# Patient Record
Sex: Male | Born: 1945 | Race: Black or African American | Hispanic: No | Smoking: Never smoker
Health system: Southern US, Community
[De-identification: ages and names within clinical notes are randomized; demographics above are authoritative.]

## PROBLEM LIST (undated history)

## (undated) DIAGNOSIS — I10 Essential (primary) hypertension: Secondary | ICD-10-CM

## (undated) HISTORY — PX: PROSTATE BIOPSY: SHX241

## (undated) HISTORY — PX: PROSTATECTOMY: SHX69

---

## 2011-07-21 ENCOUNTER — Ambulatory Visit: Payer: Self-pay | Admitting: Family Medicine

## 2011-07-21 VITALS — BP 202/101 | HR 72 | Temp 97.7°F | Resp 18 | Ht 69.0 in | Wt 211.0 lb

## 2011-07-21 DIAGNOSIS — R361 Hematospermia: Secondary | ICD-10-CM

## 2011-07-21 LAB — POCT UA - MICROSCOPIC ONLY
Bacteria, U Microscopic: NEGATIVE
Casts, Ur, LPF, POC: NEGATIVE
Crystals, Ur, HPF, POC: NEGATIVE
Mucus, UA: NEGATIVE
Yeast, UA: NEGATIVE

## 2011-07-21 LAB — PSA: PSA: 2.11 ng/mL (ref <=4.00)

## 2011-07-21 LAB — POCT URINALYSIS DIPSTICK
Bilirubin, UA: NEGATIVE
Blood, UA: NEGATIVE
Glucose, UA: NEGATIVE
Ketones, UA: NEGATIVE
Nitrite, UA: NEGATIVE
Protein, UA: 30
Spec Grav, UA: 1.02
Urobilinogen, UA: 4
pH, UA: 6

## 2011-07-21 LAB — IFOBT (OCCULT BLOOD): IFOBT: NEGATIVE

## 2011-07-21 NOTE — Progress Notes (Signed)
66 yo disabled veteran who was mastubating yesterday and noted blood in his semen which alarmed him.  He usually goes to the [place], where is being treated for PTSD.  He has refused colonoscopy in the past (I encouraged him to reconsider) and left herniorrhaphy (I encouraged a repair).  O:  Alert, cooperative Genitalia:  Large left hernia which is reducible.  Normal penis and right testes Prostate:  Normal to dre Results for orders placed in visit on 07/21/11  IFOBT (OCCULT BLOOD)      Component Value Range   IFOBT Negative    POCT URINALYSIS DIPSTICK      Component Value Range   Color, UA yellow     Clarity, UA clear     Glucose, UA neg     Bilirubin, UA neg     Ketones, UA neg     Spec Grav, UA 1.020     Blood, UA neg     pH, UA 6.0     Protein, UA 30 mg/dl     Urobilinogen, UA 4.0     Nitrite, UA neg     Leukocytes, UA small (1+)    POCT UA - MICROSCOPIC ONLY      Component Value Range   WBC, Ur, HPF, POC 7-8     RBC, urine, microscopic 0-1     Bacteria, U Microscopic neg     Mucus, UA neg     Epithelial cells, urine per micros 5-10     Crystals, Ur, HPF, POC neg     Casts, Ur, LPF, POC neg     Yeast, UA neg       A:  Hematospermia, inguinal hernia  P:  I encouraged patient to get colonoscopy and herniorrhaphy with his PCP's at [facility] Check hemossure, PSA, U/A

## 2011-07-23 ENCOUNTER — Telehealth: Payer: Self-pay

## 2011-07-23 LAB — URINE CULTURE: Colony Count: 15000

## 2011-07-23 NOTE — Telephone Encounter (Signed)
[facility] IS IN OFFICE NOW AND THEY ARE IN NEED OF HIS LABS, STATES WE USUALLY SENDS THEM WITHOUT AN AUTH PLEASE FAX TO [phone] AND THE PHONE NUMBER IS [phone]  PT IS IN OFFICE NOW

## 2011-07-23 NOTE — Telephone Encounter (Signed)
Faxed over lab results per request

## 2014-07-06 DIAGNOSIS — M549 Dorsalgia, unspecified: Secondary | ICD-10-CM

## 2014-07-06 DIAGNOSIS — M25569 Pain in unspecified knee: Secondary | ICD-10-CM

## 2014-07-06 HISTORY — DX: Dorsalgia, unspecified: M54.9

## 2014-07-06 HISTORY — DX: Pain in unspecified knee: M25.569

## 2014-07-11 DIAGNOSIS — N529 Male erectile dysfunction, unspecified: Secondary | ICD-10-CM

## 2014-07-11 DIAGNOSIS — I10 Essential (primary) hypertension: Secondary | ICD-10-CM

## 2014-07-11 DIAGNOSIS — G4700 Insomnia, unspecified: Secondary | ICD-10-CM

## 2014-07-11 DIAGNOSIS — M79643 Pain in unspecified hand: Secondary | ICD-10-CM

## 2014-07-11 HISTORY — DX: Essential (primary) hypertension: I10

## 2014-07-11 HISTORY — DX: Male erectile dysfunction, unspecified: N52.9

## 2014-07-11 HISTORY — DX: Insomnia, unspecified: G47.00

## 2014-07-11 HISTORY — DX: Pain in unspecified hand: M79.643

## 2014-08-09 ENCOUNTER — Encounter (HOSPITAL_COMMUNITY): Payer: Self-pay | Admitting: Emergency Medicine

## 2014-08-09 ENCOUNTER — Emergency Department (HOSPITAL_COMMUNITY)
Admission: EM | Admit: 2014-08-09 | Discharge: 2014-08-09 | Disposition: A | Discharge status: Home / Self Care | Payer: Federal, State, Local not specified - PPO | Attending: Emergency Medicine | Admitting: Emergency Medicine

## 2014-08-09 DIAGNOSIS — I10 Essential (primary) hypertension: Secondary | ICD-10-CM | POA: Insufficient documentation

## 2014-08-09 DIAGNOSIS — R040 Epistaxis: Secondary | ICD-10-CM | POA: Insufficient documentation

## 2014-08-09 DIAGNOSIS — Z79899 Other long term (current) drug therapy: Secondary | ICD-10-CM | POA: Diagnosis not present

## 2014-08-09 HISTORY — DX: Essential (primary) hypertension: I10

## 2014-08-09 MED ORDER — OXYMETAZOLINE HCL 0.05 % NA SOLN
1.0000 | Freq: Once | NASAL | Status: AC
  Administered 2014-08-09: 1 via NASAL
  Filled 2014-08-09: qty 15

## 2014-08-09 NOTE — Discharge Instructions (Signed)
If nosebleed recurs, apply direct pressure. If no relief used Afrin the same was we used in ED.  Do not use afrin for more than 3 consecutive days. Follow-up with ENT if needed-- call to schedule appt if continue having issues with nosebleed. Return to the ED for new or worsening symptoms.

## 2014-08-09 NOTE — ED Provider Notes (Signed)
CSN: [id number]     Arrival date & time 08/09/14  0209 History   First MD Initiated Contact with Patient 08/09/14 0229     Chief Complaint  Patient presents with  . Epistaxis     (Consider location/radiation/quality/duration/timing/severity/associated sxs/prior Treatment) Patient is a 69 y.o. male presenting with nosebleeds. The history is provided by the patient and medical records.  Epistaxis   69 year old male with history of hypertension here with epistaxis. He states over the past 2 months he has been having intermittent issues with nosebleeds but he is usually able to control these by applying pressure at home. He states current nosebleed began around 2230 and has been intermittent since that time. He states he has been packing his nose with tissue but once he pulls the tissue about it begins bleeding again.  Patient is not currently on any type of anticoagulation.  He denies any nasal injuries or instrumentation.  No dizziness or lightheadedness. No feelings of syncope. VSS.  Past Medical History  Diagnosis Date  . Hypertension    No past surgical history on file. No family history on file. History  Substance Use Topics  . Smoking status: Never Smoker   . Smokeless tobacco: Not on file  . Alcohol Use: Not on file    Review of Systems  HENT: Positive for nosebleeds.   All other systems reviewed and are negative.     Allergies  Review of patient's allergies indicates no known allergies.  Home Medications   Prior to Admission medications   Medication Sig Start Date End Date Taking? Authorizing Provider  b complex vitamins tablet Take 1 tablet by mouth daily.    Historical Provider, MD  lisinopril (PRINIVIL,ZESTRIL) 20 MG tablet Take 20 mg by mouth daily.    Historical Provider, MD  Multiple Vitamin (MULTIVITAMIN) tablet Take 1 tablet by mouth daily.    Historical Provider, MD   BP 160/68 mmHg  Pulse 92  Temp(Src) 97.5 F (36.4 C) (Oral)  SpO2 100%   Physical  Exam  Constitutional: He is oriented to person, place, and time. He appears well-developed and well-nourished.  HENT:  Head: Normocephalic and atraumatic.  Mouth/Throat: Oropharynx is clear and moist.  Tissue present in left nare-- tissue removed without recurrence of bleeding; appears to be small clot noted along the lateral wall of left nare; no signs of trauma; no septal deviation or septal hematoma Right nare normal  Eyes: Conjunctivae and EOM are normal. Pupils are equal, round, and reactive to light.  Neck: Normal range of motion. Neck supple.  Cardiovascular: Normal rate, regular rhythm and normal heart sounds.   Pulmonary/Chest: Effort normal and breath sounds normal.  Musculoskeletal: Normal range of motion.  Neurological: He is alert and oriented to person, place, and time.  Skin: Skin is warm and dry.  Psychiatric: He has a normal mood and affect.  Nursing note and vitals reviewed.   ED Course  Procedures (including critical care time) Labs Review Labs Reviewed - No data to display  Imaging Review No results found.   EKG Interpretation None      MDM   Final diagnoses:  Nosebleed   69 year old male with nosebleed from left nare onset 2230.  On exam, tissue stuffed into left nostril.  Tissue removed, no evidence of recurrent bleeding.  Afrin applied and patient monitored for approx 1 hour without recurrent of nosebleed.  Will d/c home with supportive care.  Recommended direct pressure for recurrent bleed, if no relief will use afrin.  Instructed not to use Afrin for more than 3 consecutive days in a row. Patient given ENT follow-up if needed.  Discussed plan with patient, he/she acknowledged understanding and agreed with plan of care.  Return precautions given for new or worsening symptoms.  Garlon Hatchet, PA-C 08/09/14 0402  Paula Libra, MD 08/09/14 [phone]

## 2014-08-09 NOTE — ED Notes (Signed)
Pt states he has a hx of hypertension and nosebleeds but tonight he couldn't get his nose to stop bleeding. Stuffed L nostril with tissue at the moment. Alert and oriented.

## 2015-02-12 DIAGNOSIS — E669 Obesity, unspecified: Secondary | ICD-10-CM

## 2015-02-12 DIAGNOSIS — N433 Hydrocele, unspecified: Secondary | ICD-10-CM

## 2015-02-12 HISTORY — DX: Hydrocele, unspecified: N43.3

## 2015-02-12 HISTORY — DX: Obesity, unspecified: E66.9

## 2015-09-02 DIAGNOSIS — I10 Essential (primary) hypertension: Secondary | ICD-10-CM | POA: Diagnosis not present

## 2015-09-02 DIAGNOSIS — Z0189 Encounter for other specified special examinations: Secondary | ICD-10-CM | POA: Diagnosis not present

## 2015-09-02 DIAGNOSIS — Z125 Encounter for screening for malignant neoplasm of prostate: Secondary | ICD-10-CM | POA: Diagnosis not present

## 2016-01-14 DIAGNOSIS — Z86010 Personal history of colonic polyps: Secondary | ICD-10-CM

## 2016-01-14 DIAGNOSIS — Z860101 Personal history of adenomatous and serrated colon polyps: Secondary | ICD-10-CM

## 2016-01-14 HISTORY — DX: Personal history of adenomatous and serrated colon polyps: Z86.0101

## 2016-01-14 HISTORY — DX: Personal history of colonic polyps: Z86.010

## 2016-02-07 DIAGNOSIS — B1920 Unspecified viral hepatitis C without hepatic coma: Secondary | ICD-10-CM | POA: Diagnosis not present

## 2016-02-07 DIAGNOSIS — M545 Low back pain: Secondary | ICD-10-CM | POA: Diagnosis not present

## 2016-02-07 DIAGNOSIS — I10 Essential (primary) hypertension: Secondary | ICD-10-CM | POA: Diagnosis not present

## 2016-02-07 DIAGNOSIS — Z0000 Encounter for general adult medical examination without abnormal findings: Secondary | ICD-10-CM | POA: Diagnosis not present

## 2016-06-09 DIAGNOSIS — I10 Essential (primary) hypertension: Secondary | ICD-10-CM | POA: Diagnosis not present

## 2016-06-09 DIAGNOSIS — Z125 Encounter for screening for malignant neoplasm of prostate: Secondary | ICD-10-CM | POA: Diagnosis not present

## 2016-06-09 DIAGNOSIS — N529 Male erectile dysfunction, unspecified: Secondary | ICD-10-CM | POA: Diagnosis not present

## 2016-06-09 DIAGNOSIS — H9193 Unspecified hearing loss, bilateral: Secondary | ICD-10-CM | POA: Diagnosis not present

## 2016-06-09 DIAGNOSIS — Z13220 Encounter for screening for lipoid disorders: Secondary | ICD-10-CM | POA: Diagnosis not present

## 2016-07-21 DIAGNOSIS — H40003 Preglaucoma, unspecified, bilateral: Secondary | ICD-10-CM | POA: Diagnosis not present

## 2016-07-21 DIAGNOSIS — H524 Presbyopia: Secondary | ICD-10-CM | POA: Diagnosis not present

## 2016-07-21 DIAGNOSIS — I10 Essential (primary) hypertension: Secondary | ICD-10-CM | POA: Diagnosis not present

## 2016-07-21 DIAGNOSIS — H2513 Age-related nuclear cataract, bilateral: Secondary | ICD-10-CM | POA: Diagnosis not present

## 2016-08-05 DIAGNOSIS — M79642 Pain in left hand: Secondary | ICD-10-CM | POA: Diagnosis not present

## 2016-08-05 DIAGNOSIS — I10 Essential (primary) hypertension: Secondary | ICD-10-CM | POA: Diagnosis not present

## 2016-08-05 DIAGNOSIS — M79641 Pain in right hand: Secondary | ICD-10-CM | POA: Diagnosis not present

## 2016-08-05 DIAGNOSIS — Z125 Encounter for screening for malignant neoplasm of prostate: Secondary | ICD-10-CM | POA: Diagnosis not present

## 2016-08-05 DIAGNOSIS — M545 Low back pain: Secondary | ICD-10-CM | POA: Diagnosis not present

## 2016-08-19 DIAGNOSIS — H524 Presbyopia: Secondary | ICD-10-CM | POA: Diagnosis not present

## 2016-11-04 DIAGNOSIS — Z01818 Encounter for other preprocedural examination: Secondary | ICD-10-CM | POA: Diagnosis not present

## 2016-11-04 DIAGNOSIS — Z86010 Personal history of colonic polyps: Secondary | ICD-10-CM | POA: Diagnosis not present

## 2016-11-04 DIAGNOSIS — Z8619 Personal history of other infectious and parasitic diseases: Secondary | ICD-10-CM | POA: Diagnosis not present

## 2016-11-19 DIAGNOSIS — H40003 Preglaucoma, unspecified, bilateral: Secondary | ICD-10-CM | POA: Diagnosis not present

## 2016-12-01 DIAGNOSIS — K8020 Calculus of gallbladder without cholecystitis without obstruction: Secondary | ICD-10-CM

## 2016-12-01 DIAGNOSIS — K8050 Calculus of bile duct without cholangitis or cholecystitis without obstruction: Secondary | ICD-10-CM | POA: Diagnosis not present

## 2016-12-01 DIAGNOSIS — K7689 Other specified diseases of liver: Secondary | ICD-10-CM | POA: Diagnosis not present

## 2016-12-01 HISTORY — DX: Calculus of gallbladder without cholecystitis without obstruction: K80.20

## 2016-12-16 DIAGNOSIS — H524 Presbyopia: Secondary | ICD-10-CM | POA: Diagnosis not present

## 2017-02-01 DIAGNOSIS — I10 Essential (primary) hypertension: Secondary | ICD-10-CM | POA: Diagnosis not present

## 2017-02-01 DIAGNOSIS — R972 Elevated prostate specific antigen [PSA]: Secondary | ICD-10-CM | POA: Diagnosis not present

## 2017-02-04 DIAGNOSIS — K649 Unspecified hemorrhoids: Secondary | ICD-10-CM | POA: Diagnosis not present

## 2017-02-04 DIAGNOSIS — Z1211 Encounter for screening for malignant neoplasm of colon: Secondary | ICD-10-CM | POA: Diagnosis not present

## 2017-02-04 DIAGNOSIS — Z86010 Personal history of colonic polyps: Secondary | ICD-10-CM | POA: Diagnosis not present

## 2017-02-04 DIAGNOSIS — K5730 Diverticulosis of large intestine without perforation or abscess without bleeding: Secondary | ICD-10-CM | POA: Diagnosis not present

## 2017-02-23 DIAGNOSIS — R39198 Other difficulties with micturition: Secondary | ICD-10-CM | POA: Diagnosis not present

## 2017-02-23 DIAGNOSIS — Z800 Family history of malignant neoplasm of digestive organs: Secondary | ICD-10-CM | POA: Diagnosis not present

## 2017-02-23 DIAGNOSIS — N433 Hydrocele, unspecified: Secondary | ICD-10-CM | POA: Diagnosis not present

## 2017-02-23 DIAGNOSIS — N401 Enlarged prostate with lower urinary tract symptoms: Secondary | ICD-10-CM | POA: Diagnosis not present

## 2017-04-27 DIAGNOSIS — H524 Presbyopia: Secondary | ICD-10-CM | POA: Diagnosis not present

## 2017-04-27 DIAGNOSIS — H2513 Age-related nuclear cataract, bilateral: Secondary | ICD-10-CM | POA: Diagnosis not present

## 2017-04-27 DIAGNOSIS — H40003 Preglaucoma, unspecified, bilateral: Secondary | ICD-10-CM | POA: Diagnosis not present

## 2017-04-27 DIAGNOSIS — I10 Essential (primary) hypertension: Secondary | ICD-10-CM | POA: Diagnosis not present

## 2017-06-15 DIAGNOSIS — K8080 Other cholelithiasis without obstruction: Secondary | ICD-10-CM | POA: Diagnosis not present

## 2017-06-15 DIAGNOSIS — K7689 Other specified diseases of liver: Secondary | ICD-10-CM | POA: Diagnosis not present

## 2017-06-15 DIAGNOSIS — Z0000 Encounter for general adult medical examination without abnormal findings: Secondary | ICD-10-CM | POA: Diagnosis not present

## 2017-07-30 DIAGNOSIS — I10 Essential (primary) hypertension: Secondary | ICD-10-CM | POA: Diagnosis not present

## 2017-07-30 DIAGNOSIS — F4310 Post-traumatic stress disorder, unspecified: Secondary | ICD-10-CM

## 2017-07-30 DIAGNOSIS — E559 Vitamin D deficiency, unspecified: Secondary | ICD-10-CM

## 2017-07-30 DIAGNOSIS — M549 Dorsalgia, unspecified: Secondary | ICD-10-CM | POA: Diagnosis not present

## 2017-07-30 DIAGNOSIS — G4700 Insomnia, unspecified: Secondary | ICD-10-CM | POA: Diagnosis not present

## 2017-07-30 DIAGNOSIS — R972 Elevated prostate specific antigen [PSA]: Secondary | ICD-10-CM | POA: Diagnosis not present

## 2017-07-30 HISTORY — DX: Vitamin D deficiency, unspecified: E55.9

## 2017-07-30 HISTORY — DX: Post-traumatic stress disorder, unspecified: F43.10

## 2017-07-30 HISTORY — DX: Elevated prostate specific antigen (PSA): R97.20

## 2017-08-27 DIAGNOSIS — H40003 Preglaucoma, unspecified, bilateral: Secondary | ICD-10-CM | POA: Diagnosis not present

## 2017-08-31 DIAGNOSIS — I10 Essential (primary) hypertension: Secondary | ICD-10-CM | POA: Diagnosis not present

## 2017-08-31 DIAGNOSIS — H2513 Age-related nuclear cataract, bilateral: Secondary | ICD-10-CM | POA: Diagnosis not present

## 2017-08-31 DIAGNOSIS — H40003 Preglaucoma, unspecified, bilateral: Secondary | ICD-10-CM | POA: Diagnosis not present

## 2017-08-31 DIAGNOSIS — H524 Presbyopia: Secondary | ICD-10-CM | POA: Diagnosis not present

## 2017-10-13 DIAGNOSIS — H2513 Age-related nuclear cataract, bilateral: Secondary | ICD-10-CM | POA: Diagnosis not present

## 2017-10-13 DIAGNOSIS — H5203 Hypermetropia, bilateral: Secondary | ICD-10-CM | POA: Diagnosis not present

## 2017-11-19 DIAGNOSIS — K862 Cyst of pancreas: Secondary | ICD-10-CM | POA: Diagnosis not present

## 2017-11-19 DIAGNOSIS — K769 Liver disease, unspecified: Secondary | ICD-10-CM | POA: Diagnosis not present

## 2017-11-19 DIAGNOSIS — B199 Unspecified viral hepatitis without hepatic coma: Secondary | ICD-10-CM

## 2017-11-19 DIAGNOSIS — Z1389 Encounter for screening for other disorder: Secondary | ICD-10-CM | POA: Diagnosis not present

## 2017-11-19 DIAGNOSIS — R932 Abnormal findings on diagnostic imaging of liver and biliary tract: Secondary | ICD-10-CM | POA: Diagnosis not present

## 2017-11-19 DIAGNOSIS — N200 Calculus of kidney: Secondary | ICD-10-CM | POA: Diagnosis not present

## 2017-11-19 HISTORY — DX: Unspecified viral hepatitis without hepatic coma: B19.9

## 2018-03-04 DIAGNOSIS — H40003 Preglaucoma, unspecified, bilateral: Secondary | ICD-10-CM | POA: Diagnosis not present

## 2018-03-25 DIAGNOSIS — N433 Hydrocele, unspecified: Secondary | ICD-10-CM | POA: Diagnosis not present

## 2018-05-02 DIAGNOSIS — N433 Hydrocele, unspecified: Secondary | ICD-10-CM | POA: Diagnosis not present

## 2018-05-02 DIAGNOSIS — Z800 Family history of malignant neoplasm of digestive organs: Secondary | ICD-10-CM | POA: Diagnosis not present

## 2018-05-02 DIAGNOSIS — R972 Elevated prostate specific antigen [PSA]: Secondary | ICD-10-CM | POA: Diagnosis not present

## 2018-05-02 DIAGNOSIS — N400 Enlarged prostate without lower urinary tract symptoms: Secondary | ICD-10-CM | POA: Diagnosis not present

## 2018-07-04 DIAGNOSIS — H40003 Preglaucoma, unspecified, bilateral: Secondary | ICD-10-CM | POA: Diagnosis not present

## 2018-09-09 DIAGNOSIS — G8929 Other chronic pain: Secondary | ICD-10-CM | POA: Diagnosis not present

## 2018-09-09 DIAGNOSIS — M545 Low back pain: Secondary | ICD-10-CM | POA: Diagnosis not present

## 2019-01-31 DIAGNOSIS — N529 Male erectile dysfunction, unspecified: Secondary | ICD-10-CM | POA: Diagnosis not present

## 2019-01-31 DIAGNOSIS — N433 Hydrocele, unspecified: Secondary | ICD-10-CM | POA: Diagnosis not present

## 2019-02-13 ENCOUNTER — Other Ambulatory Visit: Payer: Self-pay

## 2019-02-13 ENCOUNTER — Encounter (HOSPITAL_COMMUNITY): Payer: Self-pay | Admitting: Emergency Medicine

## 2019-02-13 ENCOUNTER — Emergency Department (HOSPITAL_COMMUNITY)
Admission: EM | Admit: 2019-02-13 | Discharge: 2019-02-13 | Disposition: A | Discharge status: Home / Self Care | Payer: No Typology Code available for payment source | Attending: Emergency Medicine | Admitting: Emergency Medicine

## 2019-02-13 ENCOUNTER — Emergency Department (HOSPITAL_COMMUNITY): Payer: No Typology Code available for payment source

## 2019-02-13 DIAGNOSIS — S3991XA Unspecified injury of abdomen, initial encounter: Secondary | ICD-10-CM | POA: Diagnosis present

## 2019-02-13 DIAGNOSIS — I10 Essential (primary) hypertension: Secondary | ICD-10-CM | POA: Diagnosis not present

## 2019-02-13 DIAGNOSIS — Z79899 Other long term (current) drug therapy: Secondary | ICD-10-CM | POA: Insufficient documentation

## 2019-02-13 DIAGNOSIS — W010XXA Fall on same level from slipping, tripping and stumbling without subsequent striking against object, initial encounter: Secondary | ICD-10-CM | POA: Insufficient documentation

## 2019-02-13 DIAGNOSIS — Y999 Unspecified external cause status: Secondary | ICD-10-CM | POA: Diagnosis not present

## 2019-02-13 DIAGNOSIS — Y9389 Activity, other specified: Secondary | ICD-10-CM | POA: Insufficient documentation

## 2019-02-13 DIAGNOSIS — Y929 Unspecified place or not applicable: Secondary | ICD-10-CM | POA: Insufficient documentation

## 2019-02-13 DIAGNOSIS — R55 Syncope and collapse: Secondary | ICD-10-CM | POA: Insufficient documentation

## 2019-02-13 DIAGNOSIS — S2232XA Fracture of one rib, left side, initial encounter for closed fracture: Secondary | ICD-10-CM | POA: Insufficient documentation

## 2019-02-13 LAB — CBC WITH DIFFERENTIAL/PLATELET
Abs Immature Granulocytes: 0.01 10*3/uL (ref 0.00–0.07)
Basophils Absolute: 0 10*3/uL (ref 0.0–0.1)
Basophils Relative: 0 %
Eosinophils Absolute: 0 10*3/uL (ref 0.0–0.5)
Eosinophils Relative: 1 %
HCT: 49.2 % (ref 39.0–52.0)
Hemoglobin: 15.9 g/dL (ref 13.0–17.0)
Immature Granulocytes: 0 %
Lymphocytes Relative: 33 %
Lymphs Abs: 1.5 10*3/uL (ref 0.7–4.0)
MCH: 28.6 pg (ref 26.0–34.0)
MCHC: 32.3 g/dL (ref 30.0–36.0)
MCV: 88.6 fL (ref 80.0–100.0)
Monocytes Absolute: 0.4 10*3/uL (ref 0.1–1.0)
Monocytes Relative: 9 %
Neutro Abs: 2.6 10*3/uL (ref 1.7–7.7)
Neutrophils Relative %: 57 %
Platelets: 143 10*3/uL — ABNORMAL LOW (ref 150–400)
RBC: 5.55 MIL/uL (ref 4.22–5.81)
RDW: 13.9 % (ref 11.5–15.5)
WBC: 4.5 10*3/uL (ref 4.0–10.5)
nRBC: 0 % (ref 0.0–0.2)

## 2019-02-13 LAB — URINALYSIS, ROUTINE W REFLEX MICROSCOPIC
Bilirubin Urine: NEGATIVE
Glucose, UA: NEGATIVE mg/dL
Ketones, ur: 5 mg/dL — AB
Nitrite: NEGATIVE
Protein, ur: NEGATIVE mg/dL
Specific Gravity, Urine: 1.017 (ref 1.005–1.030)
pH: 5 (ref 5.0–8.0)

## 2019-02-13 LAB — COMPREHENSIVE METABOLIC PANEL
ALT: 27 U/L (ref 0–44)
AST: 43 U/L — ABNORMAL HIGH (ref 15–41)
Albumin: 4.6 g/dL (ref 3.5–5.0)
Alkaline Phosphatase: 49 U/L (ref 38–126)
Anion gap: 10 (ref 5–15)
BUN: 11 mg/dL (ref 8–23)
CO2: 26 mmol/L (ref 22–32)
Calcium: 8.8 mg/dL — ABNORMAL LOW (ref 8.9–10.3)
Chloride: 103 mmol/L (ref 98–111)
Creatinine, Ser: 1.03 mg/dL (ref 0.61–1.24)
GFR calc Af Amer: >60 mL/min (ref >60)
GFR calc non Af Amer: >60 mL/min (ref >60)
Glucose, Bld: 96 mg/dL (ref 70–99)
Potassium: 3.8 mmol/L (ref 3.5–5.1)
Sodium: 139 mmol/L (ref 135–145)
Total Bilirubin: 1.2 mg/dL (ref 0.3–1.2)
Total Protein: 8 g/dL (ref 6.5–8.1)

## 2019-02-13 MED ORDER — ACETAMINOPHEN 500 MG PO TABS
1000.0000 mg | ORAL_TABLET | Freq: Once | ORAL | Status: AC
  Administered 2019-02-13: 1000 mg via ORAL
  Filled 2019-02-13: qty 2

## 2019-02-13 MED ORDER — LISINOPRIL 20 MG PO TABS
20.0000 mg | ORAL_TABLET | Freq: Once | ORAL | Status: AC
  Administered 2019-02-13: 20 mg via ORAL
  Filled 2019-02-13: qty 1

## 2019-02-13 MED ORDER — HYDROCHLOROTHIAZIDE 25 MG PO TABS
25.0000 mg | ORAL_TABLET | Freq: Every day | ORAL | Status: DC
  Administered 2019-02-13: 11:00:00 25 mg via ORAL
  Filled 2019-02-13: qty 1

## 2019-02-13 NOTE — ED Provider Notes (Signed)
[facility]-EMERGENCY DEPT Provider Note   CSN: [id number] Arrival date & time: 02/13/19  4098     History   Chief Complaint Chief Complaint  Patient presents with   Fall    HPI Shiraz Bastyr is a 73 y.o. male.     73 y.o male with a PMH of HTN presents to the ED s/p fall x 2 days ago. Patient reports he was drinking and he must have fallen hitting the scan of the TB with the left side of his body, he reports he was unconscious for an unknown period of time.  He is unsure whether he struck his head, however no one witnessed this fall.  Today he endorses pain along the left flank, this is more severe with palpation, walking, twisting at the waist.  Patient reports he was drinking alcohol, reports drinking half 1/5 on Sunday, he reports he does not have any prior history of alcohol abuse.  He does endorse loss of consciousness after this fall.  Has not had any other injuries or complaints of any other pain.  He is currently on hypertensive medication, reports he usually takes his lisinopril and HCTZ at noon daily.  No other injuries or complaints.  The history is provided by the patient.  Fall Pertinent negatives include no chest pain, no abdominal pain and no shortness of breath.    Past Medical History:  Diagnosis Date   Hypertension     There are no active problems to display for this patient.   History reviewed. No pertinent surgical history.      Home Medications    Prior to Admission medications   Medication Sig Start Date End Date Taking? Authorizing Provider  amLODipine (NORVASC) 10 MG tablet Take 10 mg by mouth daily.    [provider]  b complex vitamins tablet Take 1 tablet by mouth daily.    [provider]  baclofen (LIORESAL) 10 MG tablet Take 10 mg by mouth 3 (three) times daily.    [provider]  ibuprofen (ADVIL,MOTRIN) 800 MG tablet Take 800 mg by mouth every 6 (six) hours as needed for moderate pain.     [provider]  lisinopril (PRINIVIL,ZESTRIL) 20 MG tablet Take 20 mg by mouth daily.    [provider]  sildenafil (VIAGRA) 25 MG tablet Take 25 mg by mouth daily as needed for erectile dysfunction.    [provider]  traZODone (DESYREL) 100 MG tablet Take 100 mg by mouth at bedtime.    [provider]    Family History No family history on file.  Social History Social History   Tobacco Use   Smoking status: Never Smoker   Smokeless tobacco: Never Used  Substance Use Topics   Alcohol use: Yes   Drug use: Never     Allergies   Patient has no known allergies.   Review of Systems Review of Systems  Constitutional: Negative for chills and fever.  HENT: Negative for ear pain and sore throat.   Eyes: Negative for pain and visual disturbance.  Respiratory: Negative for cough and shortness of breath.   Cardiovascular: Negative for chest pain and palpitations.  Gastrointestinal: Negative for abdominal pain and vomiting.  Genitourinary: Negative for dysuria and hematuria.  Musculoskeletal: Positive for back pain. Negative for arthralgias.  Skin: Positive for wound. Negative for color change and rash.  Neurological: Negative for seizures and syncope.  All other systems reviewed and are negative.    Physical Exam Updated  Vital Signs BP (!) 165/85    Pulse 98    Temp 97.8 F (36.6 C) (Oral)    Resp 16    Ht 5\' 10"  (1.778 m)    Wt 93 kg    SpO2 97%    BMI 29.41 kg/m   Physical Exam Vitals signs and nursing note reviewed.  Constitutional:      Appearance: Normal appearance. He is well-developed.  HENT:     Head: Normocephalic and atraumatic.  Eyes:     General: No scleral icterus.    Pupils: Pupils are equal, round, and reactive to light.  Neck:     Musculoskeletal: Normal range of motion.  Cardiovascular:     Heart sounds: Normal heart sounds.  Pulmonary:     Effort: Pulmonary effort is normal. No tachypnea.     Breath  sounds: Normal breath sounds. No wheezing or rhonchi.       Comments: Significant bruising noted to the left flank. No crepitus, tender to palpation.  Chest:     Chest wall: No tenderness.  Abdominal:     General: Bowel sounds are normal. There is no distension.     Palpations: Abdomen is soft.     Tenderness: There is no abdominal tenderness.  Musculoskeletal:        General: No tenderness or deformity.  Skin:    General: Skin is warm and dry.  Neurological:     Mental Status: He is alert and oriented to person, place, and time.      ED Treatments / Results  Labs (all labs ordered are listed, but only abnormal results are displayed) Labs Reviewed  CBC WITH DIFFERENTIAL/PLATELET - Abnormal; Notable for the following components:      Result Value   Platelets 143 (*)    All other components within normal limits  COMPREHENSIVE METABOLIC PANEL - Abnormal; Notable for the following components:   Calcium 8.8 (*)    AST 43 (*)    All other components within normal limits  URINALYSIS, ROUTINE W REFLEX MICROSCOPIC - Abnormal; Notable for the following components:   Hgb urine dipstick SMALL (*)    Ketones, ur 5 (*)    Leukocytes,Ua TRACE (*)    Bacteria, UA RARE (*)    All other components within normal limits    EKG None  Radiology Dg Ribs Unilateral W/chest Left  Result Date: 02/13/2019 CLINICAL DATA:  Left rib and back pain since falling 2 days ago. EXAM: LEFT RIBS AND CHEST - 3+ VIEW COMPARISON:  None. FINDINGS: There are low lung volumes. The heart size and mediastinal contours are normal. There is minimal atelectasis at both lung bases. No significant pleural effusion or pneumothorax. No acute left-sided rib fractures are identified. There are probable small cervical ribs, left larger than right. IMPRESSION: 1. No evidence of acute left-sided rib fracture, pleural effusion or pneumothorax. 2. Minimal bibasilar atelectasis. 3. Probable small bilateral cervical ribs.  Electronically Signed   By: Carey BullocksWilliam  Veazey M.D.   On: 02/13/2019 10:23   Ct Head Wo Contrast  Result Date: 02/13/2019 CLINICAL DATA:  Fall.  Hypertension. EXAM: CT HEAD WITHOUT CONTRAST TECHNIQUE: Contiguous axial images were obtained from the base of the skull through the vertex without intravenous contrast. COMPARISON:  None. FINDINGS: Brain: There is age related volume loss. There is no evident intracranial mass, hemorrhage, extra-axial fluid collection, or midline shift. The brain parenchyma appears unremarkable. No acute infarct evident. Vascular: There is no hyperdense vessel. There is slight calcification in  the carotid siphon regions. Skull: The bony calvarium appears intact. Sinuses/Orbits: There is mucosal thickening in several ethmoid air cells. Other visualized paranasal sinuses are clear. Visualized orbits appear symmetric bilaterally. Other: Mastoid air cells are clear. IMPRESSION: Age related volume loss. Brain parenchyma appears unremarkable. No mass or hemorrhage. Slight arterial vascular calcification noted. Mucosal thickening present in several ethmoid air cells. Electronically Signed   By: Lowella Grip III M.D.   On: 02/13/2019 10:16   Ct Renal Stone Study  Result Date: 02/13/2019 CLINICAL DATA:  left flank trauma, eval for signs of blunt splenic or kidney trauma EXAM: CT ABDOMEN AND PELVIS WITHOUT CONTRAST TECHNIQUE: Multidetector CT imaging of the abdomen and pelvis was performed following the standard protocol without IV contrast. COMPARISON:  None. FINDINGS: Lower chest: Mild emphysematous change. Scattered atelectasis. No pleural effusion. Somewhat limited evaluation of the abdominal viscera given the lack of IV contrast. Hepatobiliary: There is a small cyst in the left hepatic lobe. Probable sludge in the gallbladder. No gallstones, gallbladder wall thickening, or biliary dilatation. Pancreas: Unremarkable. Spleen: Normal in size without focal abnormality. Adrenals/Urinary  Tract: Adrenal glands are unremarkable. No hydronephrosis. The kidneys are symmetric in size. No renal calculi. No perinephric hematoma. Bladder is unremarkable. Stomach/Bowel: Stomach is within normal limits. Appendix appears normal. No evidence of bowel wall thickening, distention, or inflammatory changes. Scattered colonic diverticula without evidence of diverticulitis. Vascular/Lymphatic: Minimal aortoiliac atherosclerotic calcification without evidence of aneurysm. Reproductive: Enlarged prostate. Large left hydrocele. Other: No abdominal wall hernia or abnormality. No abdominopelvic ascites. Musculoskeletal: Nondisplaced posterior left tenth rib fracture. 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 IMPRESSION: 1. Nondisplaced posterior left tenth rib fracture. 2. No evidence of acute intra-abdominal or intrapelvic pathology on a noncontrast study. 3. Enlarged prostate. 4. Large left hydrocele. 5. 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 Aortic Atherosclerosis (ICD10-I70.0) and Emphysema (ICD10-J43.9). Electronically Signed   By: Audie Pinto M.D.   On: 02/13/2019 11:04    Procedures Procedures (including critical care time)  Medications Ordered in ED Medications  hydrochlorothiazide (HYDRODIURIL) tablet 25 mg (25 mg Oral Given 02/13/19 1038)  acetaminophen (TYLENOL) tablet 1,000 mg (1,000 mg Oral Given 02/13/19 0921)  lisinopril (ZESTRIL) tablet 20 mg (20 mg Oral Given 02/13/19 1038)     Initial Impression / Assessment and Plan / ED Course  I have reviewed the triage vital signs and the nursing notes.  Pertinent labs & imaging results that were available during my care of the patient were reviewed by me and considered in my medical decision making (see chart for details).  Clinical Course as of Feb 12 1205  Mon Feb 13, 2019  1124 Has not taken blood pressure medication.  BPMarland Kitchen)(S):  720/[phone] [JS]  [place](!): TRACE [JS]  0962 WBC, UA: 6-10 [JS]    Clinical Course User Index [JS] Janeece Fitting, PA-C      Patient with a previous history of hypertension presents to the ED status post fall Sunday.  Patient reports he was drinking alcohol when he suddenly fell hitting his left flank on the TV stand.  He reports he lost consciousness as he woke up after the incident had occurred, unknown on amount of time spent on the floor.  He arrived in the ED today hypertensive with a systolic in the 836O and a diastolic in the 294 and elevated heart rate to the 1/19.  Patient reports he has not taken any medication for  improvement in symptoms.  He denies any alcohol abuse, prior history of alcohol withdrawals or hospitalizations.  CMP without any electrolyte normality, creatinine level is within normal limits, there is significant amount of bruising located to his left flank some suspicion for spleen or kidney infarction although creatinine level is unremarkable.  LFTs are within normal limits.  CBC without any abnormalities.  Will obtain CT head as patient reports loss of consciousness and an unknown time of being down.  We will also provide him with his blood pressure medication on today's visit.  A CT renal also be order to further examine any other trauma.  X-ray of his left ribs showed: 1. No evidence of acute left-sided rib fracture, pleural effusion or  pneumothorax.  2. Minimal bibasilar atelectasis.  3. Probable small bilateral cervical ribs.      CT head showed: Age related volume loss. Brain parenchyma appears unremarkable. No  mass or hemorrhage.    Slight arterial vascular calcification noted. Mucosal thickening  present in several ethmoid air cells.     CT renal showed 1. Nondisplaced posterior left tenth rib fracture.  2. No evidence of acute intra-abdominal or intrapelvic pathology on  a noncontrast study.  3. Enlarged prostate.  4. Large left  hydrocele.  5. 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    Aortic Atherosclerosis (ICD10-I70.0) and Emphysema (ICD10-J43.9).       These results were discussed at length with patient, he reports some improvement in symptoms after pathology milligrams of Tylenol.  UA with trace of leukocytes, rare bacteria.  Denies any urinary symptoms at this time, low suspicion for any UTI.  Patient will be dispo home with an incentive spirometer to help with his left 10th rib fracture.  He also be provided with structure as to how to use this.  Patient understands and agrees with management, return precautions provided.  Portions of this note were generated with Scientist, clinical (histocompatibility and immunogenetics). Dictation errors may occur despite best attempts at proofreading. Final Clinical Impressions(s) / ED Diagnoses   Final diagnoses:  Closed fracture of one rib of left side, initial encounter    ED Discharge Orders    None       Claude Manges, PA-C 02/13/19 1206    Sabas Sous, MD 02/14/19 [phone]

## 2019-02-13 NOTE — ED Notes (Signed)
Pt aware UA needed, unable to provide at this time.  

## 2019-02-13 NOTE — ED Triage Notes (Signed)
Pt complaint of left ribcage/back pain post fall on Saturday night with drinking ETOH.

## 2019-02-13 NOTE — Discharge Instructions (Signed)
Your CT today showed a fracture of your left 10th rib.  A send the spirometer has been provided for you, you were instructed how to use as well in the ED, please use daily for the next week.  You may alternate ibuprofen or Tylenol to help with your pain.

## 2020-05-08 IMAGING — CR DG RIBS W/ CHEST 3+V*L*
3 series · 3 of 3 positions shown · non-contrast
Comparison: None.

CLINICAL DATA: Left rib and back pain since falling 2 days ago.

EXAM:
LEFT RIBS AND CHEST - 3+ VIEW

[w chest pa]
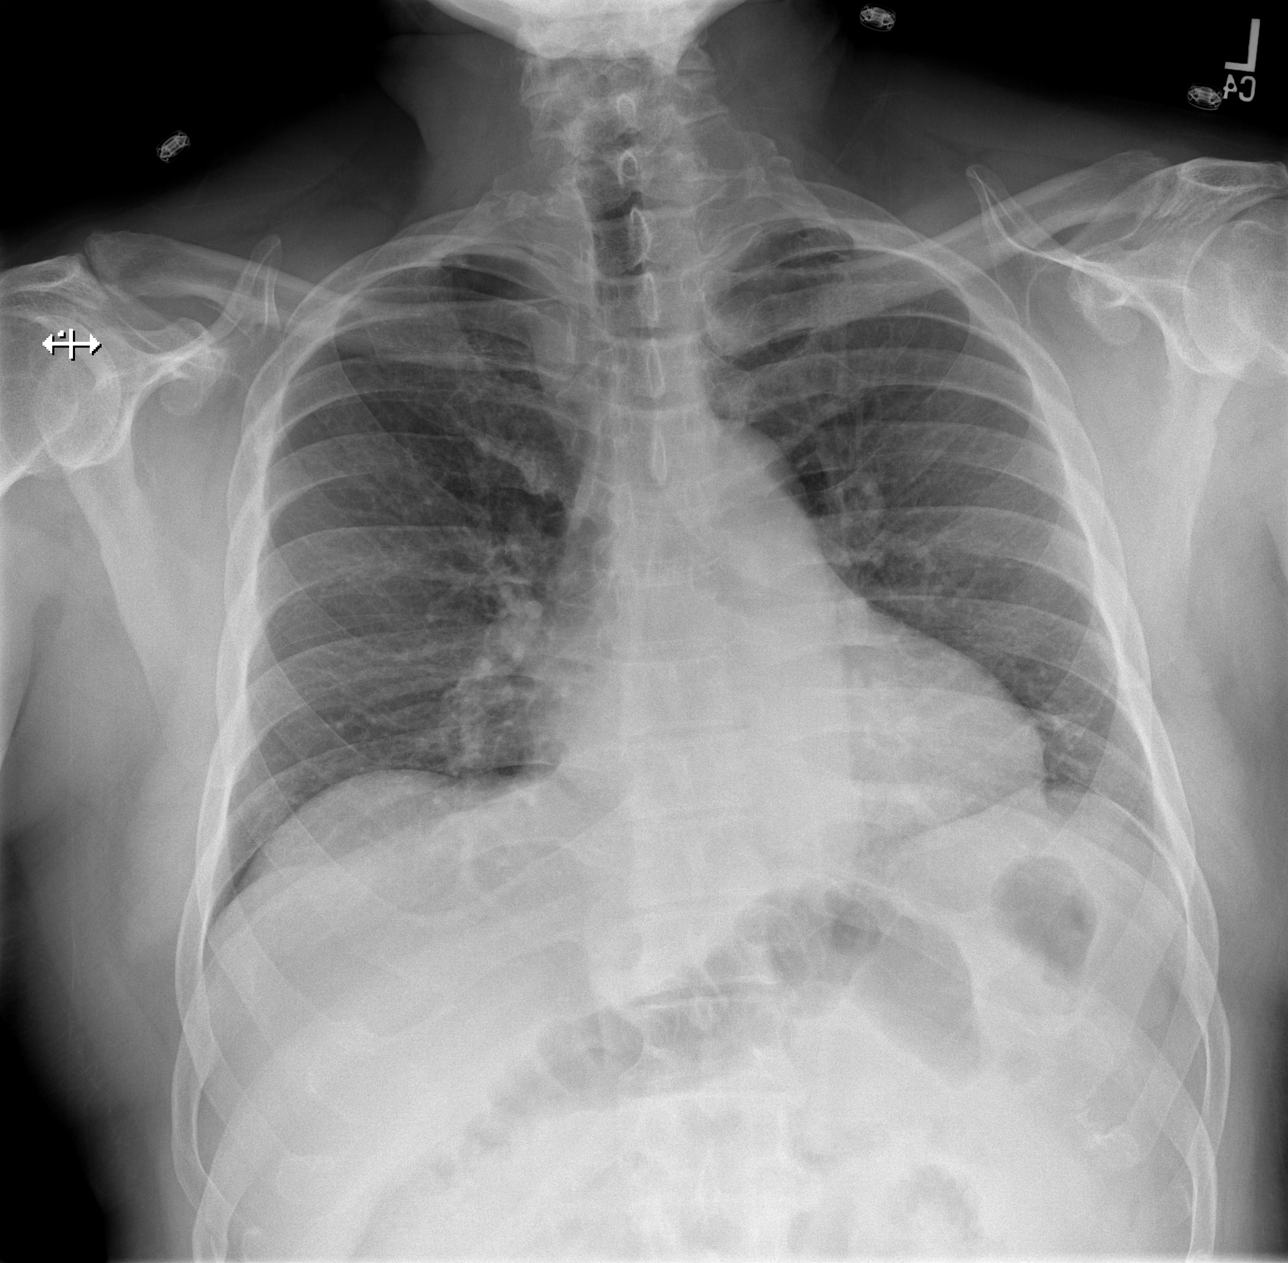

[w ribs ap upper left]
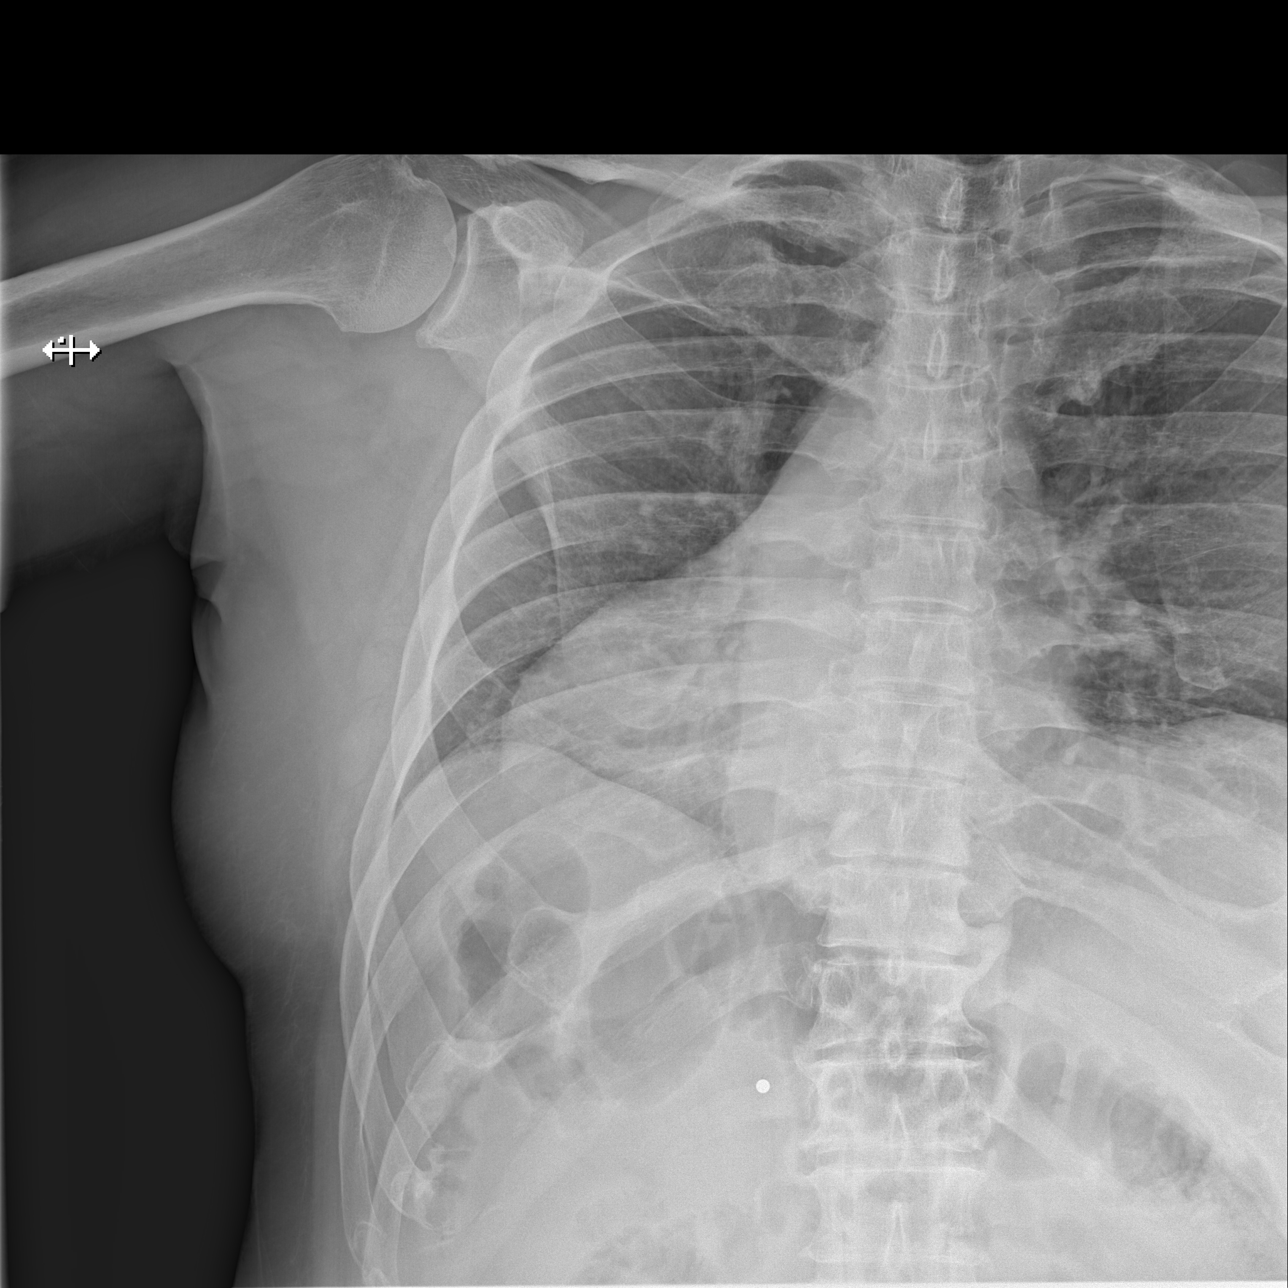

[w ribs obl left]
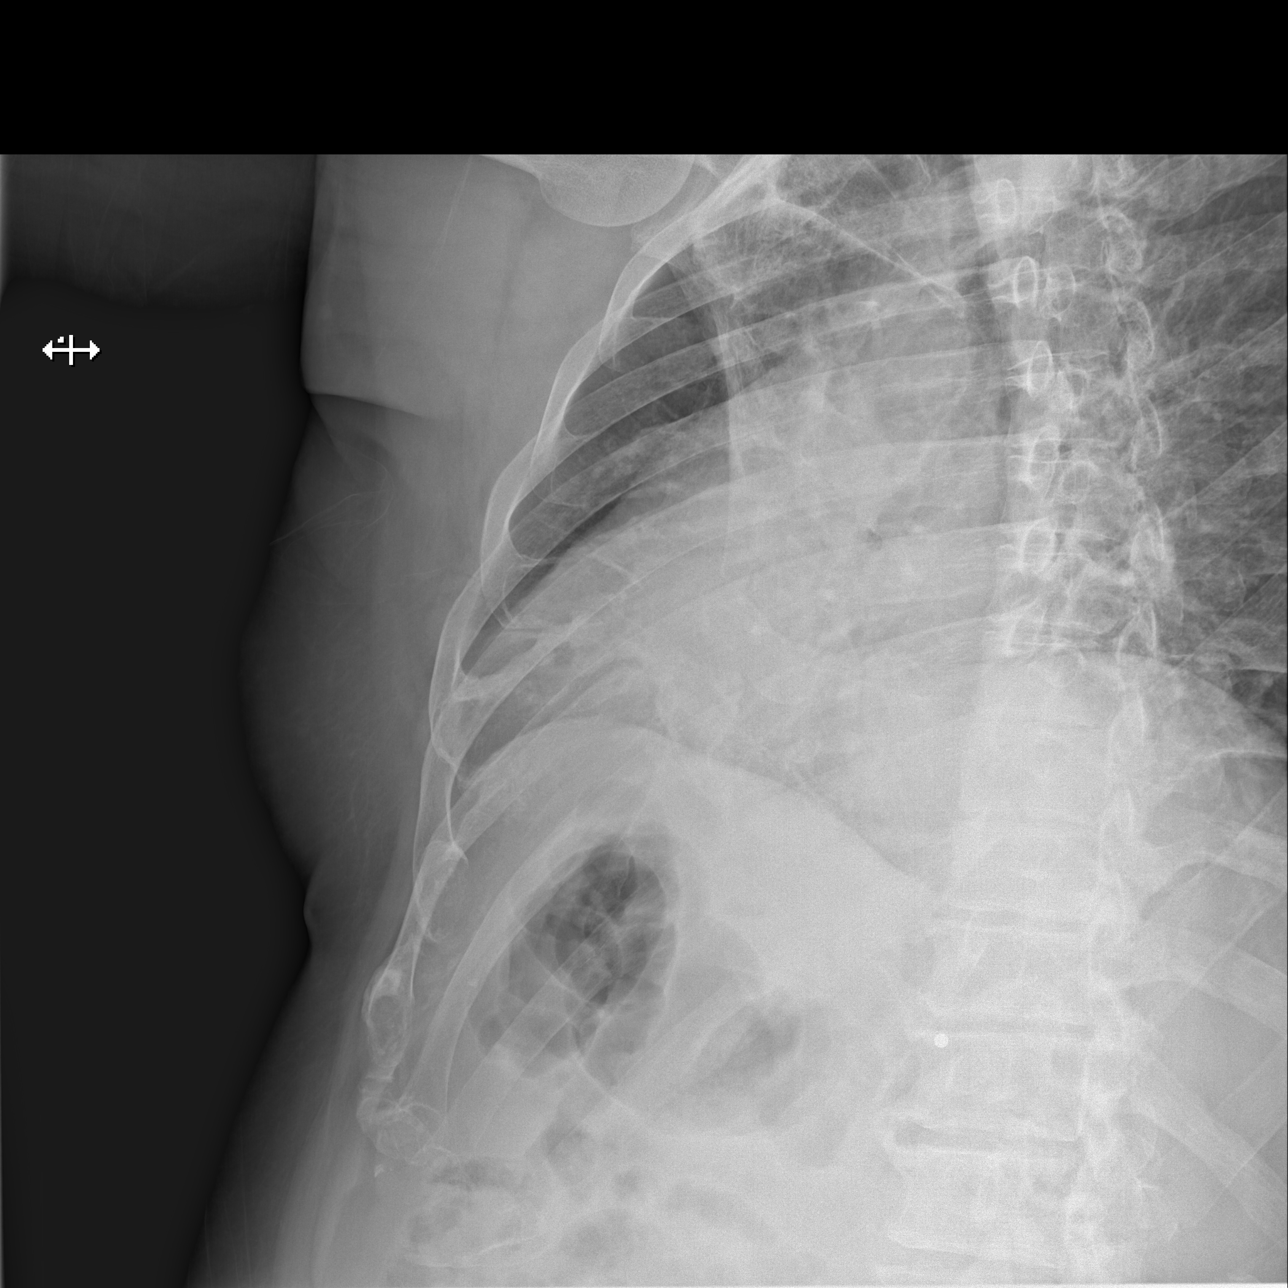

[3 of 3 positions shown; findings below may reference images not displayed]

FINDINGS: There are low lung volumes. The heart size and mediastinal contours
are normal. There is minimal atelectasis at both lung bases. No
significant pleural effusion or pneumothorax.

No acute left-sided rib fractures are identified. There are probable
small cervical ribs, left larger than right.
IMPRESSION: 1. No evidence of acute left-sided rib fracture, pleural effusion or
pneumothorax.
2. Minimal bibasilar atelectasis.
3. Probable small bilateral cervical ribs.

## 2020-11-13 DIAGNOSIS — H409 Unspecified glaucoma: Secondary | ICD-10-CM

## 2020-11-13 HISTORY — DX: Unspecified glaucoma: H40.9

## 2021-02-27 DIAGNOSIS — R0600 Dyspnea, unspecified: Secondary | ICD-10-CM

## 2021-02-27 HISTORY — DX: Dyspnea, unspecified: R06.00

## 2022-05-04 DIAGNOSIS — H524 Presbyopia: Secondary | ICD-10-CM | POA: Diagnosis not present

## 2022-09-17 DIAGNOSIS — H903 Sensorineural hearing loss, bilateral: Secondary | ICD-10-CM

## 2022-09-17 HISTORY — DX: Sensorineural hearing loss, bilateral: H90.3

## 2022-11-11 ENCOUNTER — Encounter: Payer: Self-pay | Admitting: Radiation Oncology

## 2022-11-30 ENCOUNTER — Other Ambulatory Visit (HOSPITAL_COMMUNITY): Payer: Self-pay | Admitting: Radiation Oncology

## 2022-11-30 DIAGNOSIS — C61 Malignant neoplasm of prostate: Secondary | ICD-10-CM

## 2022-12-02 ENCOUNTER — Other Ambulatory Visit (HOSPITAL_COMMUNITY): Payer: Self-pay | Admitting: Radiation Oncology

## 2022-12-02 DIAGNOSIS — C61 Malignant neoplasm of prostate: Secondary | ICD-10-CM

## 2022-12-14 ENCOUNTER — Encounter: Payer: Self-pay | Admitting: Radiation Oncology

## 2022-12-14 NOTE — Progress Notes (Addendum)
GU Location of Tumor / Histology: Prostate Ca biochemical recurrence  Prostatectomy (11/10/2022)    12/15/2022 Dr. Rogelia Mire NM PET ([facility]) Skull to Mid Thigh CLINICAL DATA: Prostate carcinoma with biochemical recurrence   IMPRESSION: 1. No evidence of local prostate carcinoma recurrence in the prostatectomy bed. 2. No evidence of metastatic adenopathy in the pelvis or periaortic retroperitoneum. 3. No evidence of visceral metastasis or skeletal metastasis. 4. Large RIGHT scrotal hydrocele.  Past/Anticipated interventions by urology, if any:     Past/Anticipated interventions by medical oncology, if any: NA  Weight changes, if any: No  IPSS:  4 SHIM:  5  Bowel/Bladder complaints, if any:  No  Nausea/Vomiting, if any: No  Pain issues, if any:  0/10  SAFETY ISSUES: Prior radiation?  No Pacemaker/ICD? No Possible current pregnancy? Male Is the patient on methotrexate? No  Current Complaints / other details:

## 2022-12-15 ENCOUNTER — Encounter (HOSPITAL_COMMUNITY)
Admission: RE | Admit: 2022-12-15 | Discharge: 2022-12-15 | Disposition: A | Discharge status: Home / Self Care | Payer: No Typology Code available for payment source | Source: Ambulatory Visit | Attending: Radiation Oncology | Admitting: Radiation Oncology

## 2022-12-15 DIAGNOSIS — C61 Malignant neoplasm of prostate: Secondary | ICD-10-CM | POA: Diagnosis present

## 2022-12-15 MED ORDER — FLOTUFOLASTAT F 18 GALLIUM 296-5846 MBQ/ML IV SOLN
8.7000 | Freq: Once | INTRAVENOUS | Status: AC
  Administered 2022-12-15: 8.7 via INTRAVENOUS

## 2022-12-18 ENCOUNTER — Encounter: Payer: Self-pay | Admitting: Radiation Oncology

## 2022-12-18 ENCOUNTER — Ambulatory Visit
Admission: RE | Admit: 2022-12-18 | Discharge: 2022-12-18 | Disposition: A | Discharge status: Home / Self Care | Payer: Medicare Other | Source: Ambulatory Visit | Attending: Radiation Oncology | Admitting: Radiation Oncology

## 2022-12-18 ENCOUNTER — Ambulatory Visit
Admission: RE | Admit: 2022-12-18 | Discharge: 2022-12-18 | Disposition: A | Discharge status: Home / Self Care | Payer: Federal, State, Local not specified - PPO | Source: Ambulatory Visit | Attending: Radiation Oncology | Admitting: Radiation Oncology

## 2022-12-18 VITALS — BP 139/74 | HR 92 | Temp 98.2°F | Resp 18 | Wt 213.0 lb

## 2022-12-18 DIAGNOSIS — Z86010 Personal history of colonic polyps: Secondary | ICD-10-CM | POA: Diagnosis not present

## 2022-12-18 DIAGNOSIS — C61 Malignant neoplasm of prostate: Secondary | ICD-10-CM | POA: Diagnosis present

## 2022-12-18 DIAGNOSIS — Z79899 Other long term (current) drug therapy: Secondary | ICD-10-CM | POA: Diagnosis not present

## 2022-12-18 DIAGNOSIS — I10 Essential (primary) hypertension: Secondary | ICD-10-CM | POA: Diagnosis not present

## 2022-12-18 DIAGNOSIS — Z7951 Long term (current) use of inhaled steroids: Secondary | ICD-10-CM | POA: Diagnosis not present

## 2022-12-18 DIAGNOSIS — E559 Vitamin D deficiency, unspecified: Secondary | ICD-10-CM | POA: Insufficient documentation

## 2022-12-18 DIAGNOSIS — G4700 Insomnia, unspecified: Secondary | ICD-10-CM | POA: Diagnosis not present

## 2022-12-18 NOTE — Progress Notes (Signed)
Radiation Oncology         [phone] ________________________________  Initial outpatient Consultation  Name: Jose Wilkins MRN: [id number]  Date of Service: 12/18/2022 DOB: [birth year]  YQ:[id number], Delfino Lovett, MD   REFERRING PHYSICIAN: Tressie Ellis, MD  DIAGNOSIS: 77 y/o man with biochemical recurrence of pT2N0, Gleason 4+3 adenocarcinoma of the prostate with current postoperative PSA of 0.25.    ICD-10-CM   1. Biochemically recurrent malignant neoplasm of prostate [facility])  C61    R97.21       HISTORY OF PRESENT ILLNESS: Jose Wilkins is a 77 y.o. male seen at the request of Dr. Lucrezia Europe.  He was initially diagnosed with Gleason 4+3 adenocarcinoma of the prostate and 1 of 12 cores from his biopsy on 10/04/2019 under the care of Dr. Santa Lighter at the [facility].  His PSA at the time of diagnosis was 9.25 and he elected to proceed with robotic assisted laparoscopic prostatectomy under the care of of Dr. Luane School. RALP was performed on 01/01/2020 and final surgical pathology confirmed pT2 N0, Gleason 4+3 adenocarcinoma of the prostate with positive surgical margins but no lymph node involvement or seminal vesicle involvement.  His initial postoperative PSA was very low detectable at 0.011 in June 2022 and has been gradually rising since that time up to 0.172 in November 2023, 0.192 in January 2024 and most recently at 0.25 on 10/05/2022.  He was referred to Dr. Rogelia Mire in radiation oncology at the [facility] on 10/30/2022 to discuss salvage radiation treatment.  He is interested in pursuing salvage radiation but prefers to have a treatment closer to his home here in [place]. A PSMA PET scan was performed on 12/15/2022 for disease restaging and did not show any evidence of metastatic disease or measurable macroscopic recurrence in the prostatic fossa/pelvis.  He has kindly been referred to [place] today to discuss having the salvage fossa/pelvic radiation locally, here in  [place].  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   Backache 07/06/2014   Cholelithiasis 12/01/2016   Dyspnea 02/27/2021   Elevated PSA 07/30/2017   Glaucoma 11/13/2020   Hand pain 07/11/2014   History of adenomatous polyp of colon 01/14/2016   Hydrocele of testis 02/12/2015   Hypertension 07/11/2014   Impotence 07/11/2014   Insomnia 07/11/2014   Knee pain 07/06/2014   Obesity 02/12/2015   PTSD (post-traumatic stress disorder) 07/30/2017   Viral hepatitis 11/19/2017   Vitamin D deficiency 07/30/2017      PAST SURGICAL HISTORY: Past Surgical History:  Procedure Laterality Date   PROSTATE BIOPSY     PROSTATECTOMY      FAMILY HISTORY: History reviewed. No pertinent family history.  SOCIAL HISTORY:  Social History   Socioeconomic History   Marital status: Widowed    Spouse name: Not on file   Number of children: Not on file   Years of education: Not on file   Highest education level: Not on file  Occupational History   Not on file  Tobacco Use   Smoking status: Never   Smokeless tobacco: Never  Vaping Use   Vaping status: Never Used  Substance and Sexual Activity   Alcohol use: Yes    Comment: on weekend drink a beer   Drug use: Never   Sexual activity: Not on file  Other Topics Concern   Not on file  Social History Narrative   Not on file  Social Determinants of Health   Financial Resource Strain: Low Risk  (04/26/2020)   Received from [facility], [facility]   Overall Financial Resource Strain (CARDIA)    Difficulty of Paying Living Expenses: Not hard at all  Food Insecurity: No Food Insecurity (12/18/2022)   Hunger Vital Sign    Worried About Running Out of Food in the Last Year: Never true    Ran Out of Food in the Last Year: Never true  Transportation Needs: No Transportation Needs (12/18/2022)   PRAPARE - Scientist, research (physical sciences) (Medical): No    Lack of Transportation (Non-Medical): No  Physical Activity: Not on file  Stress: No Stress Concern Present (04/26/2020)   Received from [facility], [facility] - Occupational Stress Questionnaire    Feeling of Stress : Not at all  Social Connections: Unknown (09/02/2021)   Received from [facility], [facility]   Social Network    Social Network: Not on file  Intimate Partner Violence: Not At Risk (12/18/2022)   Humiliation, Afraid, Rape, and Kick questionnaire    Fear of Current or Ex-Partner: No    Emotionally Abused: No    Physically Abused: No    Sexually Abused: No    ALLERGIES: Patient has no known allergies.  MEDICATIONS:  Current Outpatient Medications  Medication Sig Dispense Refill   albuterol (VENTOLIN HFA) 108 (90 Base) MCG/ACT inhaler Inhale 2 puffs into the lungs 4 (four) times daily as needed.     amLODipine (NORVASC) 10 MG tablet Take 10 mg by mouth daily.     b complex vitamins tablet Take 1 tablet by mouth daily.     baclofen (LIORESAL) 10 MG tablet Take 10 mg by mouth 3 (three) times daily.     fluticasone-salmeterol (ADVAIR) 500-50 MCG/ACT AEPB Take 1 puff by mouth in the morning and at bedtime. Rinse mouth with water after each use.     furosemide (LASIX) 20 MG tablet Take 20 mg by mouth. Take one tablet by mouth Monday, Wednesday, and Friday as needed for leg swelling.     hydrochlorothiazide (HYDRODIURIL) 25 MG tablet Take 1 tablet by mouth daily.     ibuprofen (ADVIL,MOTRIN) 800 MG tablet Take 800 mg by mouth every 6 (six) hours as needed for moderate pain.     lisinopril (ZESTRIL) 40 MG tablet Take 1 tablet by mouth daily.     oxybutynin (DITROPAN) 5 MG tablet Take 5 mg by mouth 3 (three) times daily as needed.     polyethylene glycol (MIRALAX / GLYCOLAX) 17 g packet Take 17 g by mouth daily.     sildenafil (VIAGRA) 100 MG tablet TAKE ONE-HALF TABLET BY MOUTH AS INSTRUCTED AS  NEEDED (TAKE 1 HOUR PRIOR TO SEXUAL ACTIVITY *DO NOT EXCEED 1 DOSE PER 24 HOUR PERIOD*)     traZODone (DESYREL) 50 MG tablet Take 50 mg by mouth at bedtime.     No current facility-administered medications for this encounter.    REVIEW OF SYSTEMS:  On review of systems, the patient reports that he is doing well overall.  He denies any chest pain, shortness of breath, cough, fevers, chills, night sweats, unintended weight changes.  He denies any bowel or bladder disturbances, and denies abdominal pain, nausea or vomiting.  He does have some persistent postoperative incontinence and erectile dysfunction.  His IPSS score is 4, indicating minimal urinary symptoms, mainly frequency.  His SHIM score is 5, indicating severe postoperative  erectile dysfunction.  He denies any new musculoskeletal or joint aches or pains. A complete review of systems is obtained and is otherwise negative.    PHYSICAL EXAM:  Wt Readings from Last 3 Encounters:  12/18/22 213 lb (96.6 kg)  02/13/19 205 lb (93 kg)  07/21/11 211 lb (95.7 kg)   Temp Readings from Last 3 Encounters:  12/18/22 98.2 F (36.8 C) (Oral)  02/13/19 97.8 F (36.6 C) (Oral)  08/09/14 97.9 F (36.6 C) (Oral)   BP Readings from Last 3 Encounters:  12/18/22 139/74  02/13/19 (!) 165/85  08/09/14 109/61   Pulse Readings from Last 3 Encounters:  12/18/22 92  02/13/19 98  08/09/14 64   Pain Assessment Pain Score: 0-No pain/10  In general this is a well appearing African-American man, appearing younger than his stated age and in no acute distress.  He's alert and oriented x4 and appropriate throughout the examination. Cardiopulmonary assessment is negative for acute distress and he exhibits normal effort.   KPS = 100  100 - Normal; no complaints; no evidence of disease. 90   - Able to carry on normal activity; minor signs or symptoms of disease. 80   - Normal activity with effort; some signs or symptoms of disease. 59   - Cares for self;  unable to carry on normal activity or to do active work. 60   - Requires occasional assistance, but is able to care for most of his personal needs. 50   - Requires considerable assistance and frequent medical care. 40   - Disabled; requires special care and assistance. 30   - Severely disabled; hospital admission is indicated although death not imminent. 20   - Very sick; hospital admission necessary; active supportive treatment necessary. 10   - Moribund; fatal processes progressing rapidly. 0     - Dead  Karnofsky DA, Abelmann WH, Craver LS and Burchenal [facility] [phone]) The use of the nitrogen mustards in the palliative treatment of carcinoma: with particular reference to bronchogenic carcinoma Cancer 1 [phone]  LABORATORY DATA:  Lab Results  Component Value Date   WBC 4.5 02/13/2019   HGB 15.9 02/13/2019   HCT 49.2 02/13/2019   MCV 88.6 02/13/2019   PLT 143 (L) 02/13/2019   Lab Results  Component Value Date   NA 139 02/13/2019   K 3.8 02/13/2019   CL 103 02/13/2019   CO2 26 02/13/2019   Lab Results  Component Value Date   ALT 27 02/13/2019   AST 43 (H) 02/13/2019   ALKPHOS 49 02/13/2019   BILITOT 1.2 02/13/2019     RADIOGRAPHY: NM PET (PSMA) SKULL TO MID THIGH  Result Date: 12/15/2022 CLINICAL DATA:  Prostate carcinoma with biochemical recurrence. EXAM: NUCLEAR MEDICINE PET SKULL BASE TO THIGH TECHNIQUE: 8.7 mCi F18 Flotufolastat (Posluma) was injected intravenously. Full-ring PET imaging was performed from the skull base to thigh after the radiotracer. CT data was obtained and used for attenuation correction and anatomic localization. COMPARISON:  None Available. FINDINGS: NECK No radiotracer activity in neck lymph nodes. Incidental CT finding: None. CHEST No radiotracer accumulation within mediastinal or hilar lymph nodes. No suspicious pulmonary nodules on the CT scan. Incidental CT finding: None. ABDOMEN/PELVIS Prostate: No radiotracer activity in prostatectomy bed. Lymph nodes:  No abnormal radiotracer accumulation within pelvic or abdominal nodes. Liver: No evidence of liver metastasis. Incidental CT finding: Large RIGHT scrotal hydrocele measuring 6 cm in diameter. SKELETON No focal activity to suggest skeletal metastasis. IMPRESSION: 1. No evidence of local prostate  carcinoma recurrence in the prostatectomy bed. 2. No evidence of metastatic adenopathy in the pelvis or periaortic retroperitoneum. 3. No evidence of visceral metastasis or skeletal metastasis. 4. Large RIGHT scrotal hydrocele. Electronically Signed   By: Genevive Bi M.D.   On: 12/15/2022 14:36      IMPRESSION/PLAN: 1. 77 y.o. man with biochemical recurrence of pT2N0, Gleason 4+3 adenocarcinoma of the prostate with current postoperative PSA of 0.25. Today we reviewed the findings and workup thus far.  We discussed the natural history of prostate cancer.  We reviewed the implications of positive margins, extracapsular extension, and seminal vesicle involvement on the risk of prostate cancer recurrence. In his case, he did have a positive surgical margin and a very low detectable postoperative PSA. We reviewed some of the evidence suggesting an advantage for patients with detectable, rising postoperative PSA, who undergo early salvage therapy which can lead to excellent results in terms of disease control and survival. We discussed radiation treatment directed to the prostatic fossa with regard to the logistics and delivery of a 7.5 week course of daily external beam radiotherapy as well as anticipated acute and long-term side effects associated with this treatment.  He was encouraged to ask questions that were answered to his stated satisfaction.  At the conclusion of our conversation, the patient is interested in moving forward with 7.5 weeks of salvage external beam therapy. We will share our discussion with his team at the [facility] and move forward with treatment planning accordingly. The patient appears to have a  good understanding of his disease and our treatment recommendations which are of curative intent and is in agreement with the stated plan. Therefore, we will move forward with treatment planning, in anticipation of beginning IMRT in the near future. We enjoyed meeting him today and look forward to continuing to participate in his care.   We personally spent 60 minutes in this encounter including chart review, reviewing radiological studies, meeting face-to-face with the patient, entering orders and completing documentation.    Marguarite Arbour, PA-C    Margaretmary Dys, MD  [facility]  Radiation Oncology Direct Dial: [phone]  Fax: [phone] [email or web address]  Skype  LinkedIn

## 2022-12-24 NOTE — Progress Notes (Signed)
Spoke with patient via telephone to introduce myself as the prostate nurse navigator and discussed my role.  Patient had questions related to side effects of treatment.  RN answered all questions.  No barriers identified.  Provided direct contact for any questions or concerns that may arise.

## 2022-12-30 ENCOUNTER — Ambulatory Visit
Admission: RE | Admit: 2022-12-30 | Discharge: 2022-12-30 | Disposition: A | Discharge status: Home / Self Care | Payer: Federal, State, Local not specified - PPO | Source: Ambulatory Visit | Attending: Radiation Oncology | Admitting: Radiation Oncology

## 2022-12-30 DIAGNOSIS — C61 Malignant neoplasm of prostate: Secondary | ICD-10-CM | POA: Insufficient documentation

## 2022-12-30 DIAGNOSIS — Z510 Encounter for antineoplastic radiation therapy: Secondary | ICD-10-CM | POA: Insufficient documentation

## 2022-12-30 NOTE — Progress Notes (Signed)
  Radiation Oncology         [phone] ________________________________  Name: Jose Wilkins MRN: [id number]  Date: 12/30/2022  DOB: Sep 08, 1945  SIMULATION AND TREATMENT PLANNING NOTE    ICD-10-CM   1. Biochemically recurrent malignant neoplasm of prostate [facility])  C61    R97.21       DIAGNOSIS:  78 y/o man with biochemical recurrence of pT2N0, Gleason 4+3 adenocarcinoma of the prostate with current postoperative PSA of 0.25.   NARRATIVE:  The patient was brought to the CT Simulation planning suite.  Identity was confirmed.  All relevant records and images related to the planned course of therapy were reviewed.  The patient freely provided informed written consent to proceed with treatment after reviewing the details related to the planned course of therapy. The consent form was witnessed and verified by the simulation staff.  Then, the patient was set-up in a stable reproducible supine position for radiation therapy.  A vacuum lock pillow device was custom fabricated to position his legs in a reproducible immobilized position.  Then, I performed a urethrogram under sterile conditions to identify the prostatic apex.  CT images were obtained.  Surface markings were placed.  The CT images were loaded into the planning software.  Then the prostate target and avoidance structures including the rectum, bladder, bowel and hips were contoured.  Treatment planning then occurred.  The radiation prescription was entered and confirmed.  A total of one complex treatment devices was fabricated. I have requested : Intensity Modulated Radiotherapy (IMRT) is medically necessary for this case for the following reason:  Rectal sparing.Marland Kitchen  PLAN:   The prostate fossa and pelvic lymph nodes will initially be treated to 45 Gy in 25 fractions of 1.8 Gy followed by a boost to the fossa only, to 68.4 Gy with 13 additional fractions of 1.8 Gy   ________________________________  Artist Pais Kathrynn Running, M.D.

## 2023-01-04 DIAGNOSIS — Z510 Encounter for antineoplastic radiation therapy: Secondary | ICD-10-CM | POA: Diagnosis not present

## 2023-01-04 DIAGNOSIS — C61 Malignant neoplasm of prostate: Secondary | ICD-10-CM | POA: Diagnosis not present

## 2023-01-11 ENCOUNTER — Ambulatory Visit
Admission: RE | Admit: 2023-01-11 | Discharge: 2023-01-11 | Disposition: A | Discharge status: Home / Self Care | Payer: Federal, State, Local not specified - PPO | Source: Ambulatory Visit | Attending: Radiation Oncology | Admitting: Radiation Oncology

## 2023-01-11 ENCOUNTER — Other Ambulatory Visit: Payer: Self-pay

## 2023-01-11 DIAGNOSIS — Z510 Encounter for antineoplastic radiation therapy: Secondary | ICD-10-CM | POA: Diagnosis not present

## 2023-01-11 DIAGNOSIS — C61 Malignant neoplasm of prostate: Secondary | ICD-10-CM | POA: Diagnosis not present

## 2023-01-11 LAB — RAD ONC ARIA SESSION SUMMARY
Course Elapsed Days: 0
Plan Fractions Treated to Date: 1
Plan Prescribed Dose Per Fraction: 1.8 Gy
Plan Total Fractions Prescribed: 25
Plan Total Prescribed Dose: 45 Gy
Reference Point Dosage Given to Date: 1.8 Gy
Reference Point Session Dosage Given: 1.8 Gy
Session Number: 1

## 2023-01-12 ENCOUNTER — Ambulatory Visit
Admission: RE | Admit: 2023-01-12 | Discharge: 2023-01-12 | Disposition: A | Discharge status: Home / Self Care | Payer: Federal, State, Local not specified - PPO | Source: Ambulatory Visit | Attending: Radiation Oncology | Admitting: Radiation Oncology

## 2023-01-12 ENCOUNTER — Other Ambulatory Visit: Payer: Self-pay

## 2023-01-12 DIAGNOSIS — Z510 Encounter for antineoplastic radiation therapy: Secondary | ICD-10-CM | POA: Diagnosis not present

## 2023-01-12 DIAGNOSIS — C61 Malignant neoplasm of prostate: Secondary | ICD-10-CM | POA: Diagnosis not present

## 2023-01-12 LAB — RAD ONC ARIA SESSION SUMMARY
Course Elapsed Days: 1
Plan Fractions Treated to Date: 2
Plan Prescribed Dose Per Fraction: 1.8 Gy
Plan Total Fractions Prescribed: 25
Plan Total Prescribed Dose: 45 Gy
Reference Point Dosage Given to Date: 3.6 Gy
Reference Point Session Dosage Given: 1.8 Gy
Session Number: 2

## 2023-01-13 ENCOUNTER — Ambulatory Visit: Payer: Federal, State, Local not specified - PPO

## 2023-01-13 ENCOUNTER — Other Ambulatory Visit: Payer: Self-pay

## 2023-01-14 ENCOUNTER — Ambulatory Visit
Admission: RE | Admit: 2023-01-14 | Discharge: 2023-01-14 | Disposition: A | Discharge status: Home / Self Care | Payer: Federal, State, Local not specified - PPO | Source: Ambulatory Visit | Attending: Radiation Oncology | Admitting: Radiation Oncology

## 2023-01-14 ENCOUNTER — Other Ambulatory Visit: Payer: Self-pay

## 2023-01-14 DIAGNOSIS — C61 Malignant neoplasm of prostate: Secondary | ICD-10-CM | POA: Diagnosis not present

## 2023-01-14 DIAGNOSIS — Z510 Encounter for antineoplastic radiation therapy: Secondary | ICD-10-CM | POA: Diagnosis not present

## 2023-01-14 LAB — RAD ONC ARIA SESSION SUMMARY
Course Elapsed Days: 3
Plan Fractions Treated to Date: 3
Plan Prescribed Dose Per Fraction: 1.8 Gy
Plan Total Fractions Prescribed: 25
Plan Total Prescribed Dose: 45 Gy
Reference Point Dosage Given to Date: 5.4 Gy
Reference Point Session Dosage Given: 1.8 Gy
Session Number: 3

## 2023-01-14 NOTE — Progress Notes (Signed)
Completed FMLA forms faxed to Patient's Employer as requested. Fax transmission confirmation received. Patient picked up copy of forms for his records. No other needs or concerns voiced at this time.

## 2023-01-15 ENCOUNTER — Ambulatory Visit
Admission: RE | Admit: 2023-01-15 | Discharge: 2023-01-15 | Disposition: A | Discharge status: Home / Self Care | Payer: Federal, State, Local not specified - PPO | Source: Ambulatory Visit | Attending: Radiation Oncology | Admitting: Radiation Oncology

## 2023-01-15 ENCOUNTER — Other Ambulatory Visit: Payer: Self-pay

## 2023-01-15 DIAGNOSIS — Z510 Encounter for antineoplastic radiation therapy: Secondary | ICD-10-CM | POA: Diagnosis not present

## 2023-01-15 DIAGNOSIS — C61 Malignant neoplasm of prostate: Secondary | ICD-10-CM | POA: Diagnosis not present

## 2023-01-15 LAB — RAD ONC ARIA SESSION SUMMARY
Course Elapsed Days: 4
Plan Fractions Treated to Date: 4
Plan Prescribed Dose Per Fraction: 1.8 Gy
Plan Total Fractions Prescribed: 25
Plan Total Prescribed Dose: 45 Gy
Reference Point Dosage Given to Date: 7.2 Gy
Reference Point Session Dosage Given: 1.8 Gy
Session Number: 4

## 2023-01-18 ENCOUNTER — Other Ambulatory Visit: Payer: Self-pay

## 2023-01-18 ENCOUNTER — Ambulatory Visit
Admission: RE | Admit: 2023-01-18 | Discharge: 2023-01-18 | Disposition: A | Discharge status: Home / Self Care | Payer: Federal, State, Local not specified - PPO | Source: Ambulatory Visit | Attending: Radiation Oncology | Admitting: Radiation Oncology

## 2023-01-18 DIAGNOSIS — Z510 Encounter for antineoplastic radiation therapy: Secondary | ICD-10-CM | POA: Diagnosis not present

## 2023-01-18 DIAGNOSIS — C61 Malignant neoplasm of prostate: Secondary | ICD-10-CM | POA: Diagnosis not present

## 2023-01-18 LAB — RAD ONC ARIA SESSION SUMMARY
Course Elapsed Days: 7
Plan Fractions Treated to Date: 5
Plan Prescribed Dose Per Fraction: 1.8 Gy
Plan Total Fractions Prescribed: 25
Plan Total Prescribed Dose: 45 Gy
Reference Point Dosage Given to Date: 9 Gy
Reference Point Session Dosage Given: 1.8 Gy
Session Number: 5

## 2023-01-19 ENCOUNTER — Other Ambulatory Visit: Payer: Self-pay

## 2023-01-19 ENCOUNTER — Ambulatory Visit
Admission: RE | Admit: 2023-01-19 | Discharge: 2023-01-19 | Disposition: A | Discharge status: Home / Self Care | Payer: No Typology Code available for payment source | Source: Ambulatory Visit | Attending: Radiation Oncology | Admitting: Radiation Oncology

## 2023-01-19 DIAGNOSIS — C61 Malignant neoplasm of prostate: Secondary | ICD-10-CM | POA: Diagnosis present

## 2023-01-19 DIAGNOSIS — Z510 Encounter for antineoplastic radiation therapy: Secondary | ICD-10-CM | POA: Insufficient documentation

## 2023-01-19 LAB — RAD ONC ARIA SESSION SUMMARY
Course Elapsed Days: 8
Plan Fractions Treated to Date: 6
Plan Prescribed Dose Per Fraction: 1.8 Gy
Plan Total Fractions Prescribed: 25
Plan Total Prescribed Dose: 45 Gy
Reference Point Dosage Given to Date: 10.8 Gy
Reference Point Session Dosage Given: 1.8 Gy
Session Number: 6

## 2023-01-20 ENCOUNTER — Ambulatory Visit
Admission: RE | Admit: 2023-01-20 | Discharge: 2023-01-20 | Disposition: A | Discharge status: Home / Self Care | Payer: No Typology Code available for payment source | Source: Ambulatory Visit | Attending: Radiation Oncology | Admitting: Radiation Oncology

## 2023-01-20 ENCOUNTER — Other Ambulatory Visit: Payer: Self-pay

## 2023-01-20 DIAGNOSIS — Z510 Encounter for antineoplastic radiation therapy: Secondary | ICD-10-CM | POA: Diagnosis not present

## 2023-01-20 LAB — RAD ONC ARIA SESSION SUMMARY
Course Elapsed Days: 9
Plan Fractions Treated to Date: 7
Plan Prescribed Dose Per Fraction: 1.8 Gy
Plan Total Fractions Prescribed: 25
Plan Total Prescribed Dose: 45 Gy
Reference Point Dosage Given to Date: 12.6 Gy
Reference Point Session Dosage Given: 1.8 Gy
Session Number: 7

## 2023-01-21 ENCOUNTER — Ambulatory Visit
Admission: RE | Admit: 2023-01-21 | Discharge: 2023-01-21 | Disposition: A | Discharge status: Home / Self Care | Payer: No Typology Code available for payment source | Source: Ambulatory Visit | Attending: Radiation Oncology | Admitting: Radiation Oncology

## 2023-01-21 ENCOUNTER — Other Ambulatory Visit: Payer: Self-pay

## 2023-01-21 DIAGNOSIS — Z510 Encounter for antineoplastic radiation therapy: Secondary | ICD-10-CM | POA: Diagnosis not present

## 2023-01-21 LAB — RAD ONC ARIA SESSION SUMMARY
Course Elapsed Days: 10
Plan Fractions Treated to Date: 8
Plan Prescribed Dose Per Fraction: 1.8 Gy
Plan Total Fractions Prescribed: 25
Plan Total Prescribed Dose: 45 Gy
Reference Point Dosage Given to Date: 14.4 Gy
Reference Point Session Dosage Given: 1.8 Gy
Session Number: 8

## 2023-01-22 ENCOUNTER — Ambulatory Visit
Admission: RE | Admit: 2023-01-22 | Discharge: 2023-01-22 | Disposition: A | Discharge status: Home / Self Care | Payer: No Typology Code available for payment source | Source: Ambulatory Visit | Attending: Radiation Oncology | Admitting: Radiation Oncology

## 2023-01-22 ENCOUNTER — Other Ambulatory Visit: Payer: Self-pay

## 2023-01-22 DIAGNOSIS — Z510 Encounter for antineoplastic radiation therapy: Secondary | ICD-10-CM | POA: Diagnosis not present

## 2023-01-22 LAB — RAD ONC ARIA SESSION SUMMARY
Course Elapsed Days: 11
Plan Fractions Treated to Date: 9
Plan Prescribed Dose Per Fraction: 1.8 Gy
Plan Total Fractions Prescribed: 25
Plan Total Prescribed Dose: 45 Gy
Reference Point Dosage Given to Date: 16.2 Gy
Reference Point Session Dosage Given: 1.8 Gy
Session Number: 9

## 2023-01-25 ENCOUNTER — Other Ambulatory Visit: Payer: Self-pay

## 2023-01-25 ENCOUNTER — Ambulatory Visit
Admission: RE | Admit: 2023-01-25 | Discharge: 2023-01-25 | Disposition: A | Discharge status: Home / Self Care | Payer: No Typology Code available for payment source | Source: Ambulatory Visit | Attending: Radiation Oncology | Admitting: Radiation Oncology

## 2023-01-25 DIAGNOSIS — Z510 Encounter for antineoplastic radiation therapy: Secondary | ICD-10-CM | POA: Diagnosis not present

## 2023-01-25 LAB — RAD ONC ARIA SESSION SUMMARY
Course Elapsed Days: 14
Plan Fractions Treated to Date: 10
Plan Prescribed Dose Per Fraction: 1.8 Gy
Plan Total Fractions Prescribed: 25
Plan Total Prescribed Dose: 45 Gy
Reference Point Dosage Given to Date: 18 Gy
Reference Point Session Dosage Given: 1.8 Gy
Session Number: 10

## 2023-01-26 ENCOUNTER — Other Ambulatory Visit: Payer: Self-pay

## 2023-01-26 ENCOUNTER — Ambulatory Visit
Admission: RE | Admit: 2023-01-26 | Discharge: 2023-01-26 | Disposition: A | Discharge status: Home / Self Care | Payer: No Typology Code available for payment source | Source: Ambulatory Visit | Attending: Radiation Oncology | Admitting: Radiation Oncology

## 2023-01-26 DIAGNOSIS — Z510 Encounter for antineoplastic radiation therapy: Secondary | ICD-10-CM | POA: Diagnosis not present

## 2023-01-26 LAB — RAD ONC ARIA SESSION SUMMARY
Course Elapsed Days: 15
Plan Fractions Treated to Date: 11
Plan Prescribed Dose Per Fraction: 1.8 Gy
Plan Total Fractions Prescribed: 25
Plan Total Prescribed Dose: 45 Gy
Reference Point Dosage Given to Date: 19.8 Gy
Reference Point Session Dosage Given: 1.8 Gy
Session Number: 11

## 2023-01-27 ENCOUNTER — Other Ambulatory Visit: Payer: Self-pay

## 2023-01-27 ENCOUNTER — Ambulatory Visit
Admission: RE | Admit: 2023-01-27 | Discharge: 2023-01-27 | Disposition: A | Discharge status: Home / Self Care | Payer: No Typology Code available for payment source | Source: Ambulatory Visit | Attending: Radiation Oncology | Admitting: Radiation Oncology

## 2023-01-27 DIAGNOSIS — Z510 Encounter for antineoplastic radiation therapy: Secondary | ICD-10-CM | POA: Diagnosis not present

## 2023-01-27 LAB — RAD ONC ARIA SESSION SUMMARY
Course Elapsed Days: 16
Plan Fractions Treated to Date: 12
Plan Prescribed Dose Per Fraction: 1.8 Gy
Plan Total Fractions Prescribed: 25
Plan Total Prescribed Dose: 45 Gy
Reference Point Dosage Given to Date: 21.6 Gy
Reference Point Session Dosage Given: 1.8 Gy
Session Number: 12

## 2023-01-28 ENCOUNTER — Ambulatory Visit
Admission: RE | Admit: 2023-01-28 | Discharge: 2023-01-28 | Disposition: A | Discharge status: Home / Self Care | Payer: No Typology Code available for payment source | Source: Ambulatory Visit | Attending: Radiation Oncology | Admitting: Radiation Oncology

## 2023-01-28 ENCOUNTER — Other Ambulatory Visit: Payer: Self-pay

## 2023-01-28 DIAGNOSIS — Z510 Encounter for antineoplastic radiation therapy: Secondary | ICD-10-CM | POA: Diagnosis not present

## 2023-01-28 LAB — RAD ONC ARIA SESSION SUMMARY
Course Elapsed Days: 17
Plan Fractions Treated to Date: 13
Plan Prescribed Dose Per Fraction: 1.8 Gy
Plan Total Fractions Prescribed: 25
Plan Total Prescribed Dose: 45 Gy
Reference Point Dosage Given to Date: 23.4 Gy
Reference Point Session Dosage Given: 1.8 Gy
Session Number: 13

## 2023-01-29 ENCOUNTER — Other Ambulatory Visit: Payer: Self-pay

## 2023-01-29 ENCOUNTER — Ambulatory Visit
Admission: RE | Admit: 2023-01-29 | Discharge: 2023-01-29 | Disposition: A | Discharge status: Home / Self Care | Payer: No Typology Code available for payment source | Source: Ambulatory Visit | Attending: Radiation Oncology | Admitting: Radiation Oncology

## 2023-01-29 DIAGNOSIS — Z510 Encounter for antineoplastic radiation therapy: Secondary | ICD-10-CM | POA: Diagnosis not present

## 2023-01-29 LAB — RAD ONC ARIA SESSION SUMMARY
Course Elapsed Days: 18
Plan Fractions Treated to Date: 14
Plan Prescribed Dose Per Fraction: 1.8 Gy
Plan Total Fractions Prescribed: 25
Plan Total Prescribed Dose: 45 Gy
Reference Point Dosage Given to Date: 25.2 Gy
Reference Point Session Dosage Given: 1.8 Gy
Session Number: 14

## 2023-02-01 ENCOUNTER — Other Ambulatory Visit: Payer: Self-pay

## 2023-02-01 ENCOUNTER — Ambulatory Visit
Admission: RE | Admit: 2023-02-01 | Discharge: 2023-02-01 | Disposition: A | Discharge status: Home / Self Care | Payer: No Typology Code available for payment source | Source: Ambulatory Visit | Attending: Radiation Oncology | Admitting: Radiation Oncology

## 2023-02-01 DIAGNOSIS — Z510 Encounter for antineoplastic radiation therapy: Secondary | ICD-10-CM | POA: Diagnosis not present

## 2023-02-01 LAB — RAD ONC ARIA SESSION SUMMARY
Course Elapsed Days: 21
Plan Fractions Treated to Date: 15
Plan Prescribed Dose Per Fraction: 1.8 Gy
Plan Total Fractions Prescribed: 25
Plan Total Prescribed Dose: 45 Gy
Reference Point Dosage Given to Date: 27 Gy
Reference Point Session Dosage Given: 1.8 Gy
Session Number: 15

## 2023-02-02 ENCOUNTER — Other Ambulatory Visit: Payer: Self-pay

## 2023-02-02 ENCOUNTER — Ambulatory Visit
Admission: RE | Admit: 2023-02-02 | Discharge: 2023-02-02 | Disposition: A | Discharge status: Home / Self Care | Payer: No Typology Code available for payment source | Source: Ambulatory Visit | Attending: Radiation Oncology | Admitting: Radiation Oncology

## 2023-02-02 DIAGNOSIS — Z510 Encounter for antineoplastic radiation therapy: Secondary | ICD-10-CM | POA: Diagnosis not present

## 2023-02-02 LAB — RAD ONC ARIA SESSION SUMMARY
Course Elapsed Days: 22
Plan Fractions Treated to Date: 16
Plan Prescribed Dose Per Fraction: 1.8 Gy
Plan Total Fractions Prescribed: 25
Plan Total Prescribed Dose: 45 Gy
Reference Point Dosage Given to Date: 28.8 Gy
Reference Point Session Dosage Given: 1.8 Gy
Session Number: 16

## 2023-02-03 ENCOUNTER — Other Ambulatory Visit: Payer: Self-pay

## 2023-02-03 ENCOUNTER — Ambulatory Visit
Admission: RE | Admit: 2023-02-03 | Discharge: 2023-02-03 | Disposition: A | Discharge status: Home / Self Care | Payer: No Typology Code available for payment source | Source: Ambulatory Visit | Attending: Radiation Oncology | Admitting: Radiation Oncology

## 2023-02-03 DIAGNOSIS — Z510 Encounter for antineoplastic radiation therapy: Secondary | ICD-10-CM | POA: Diagnosis not present

## 2023-02-03 LAB — RAD ONC ARIA SESSION SUMMARY
Course Elapsed Days: 23
Plan Fractions Treated to Date: 17
Plan Prescribed Dose Per Fraction: 1.8 Gy
Plan Total Fractions Prescribed: 25
Plan Total Prescribed Dose: 45 Gy
Reference Point Dosage Given to Date: 30.6 Gy
Reference Point Session Dosage Given: 1.8 Gy
Session Number: 17

## 2023-02-04 ENCOUNTER — Ambulatory Visit
Admission: RE | Admit: 2023-02-04 | Discharge: 2023-02-04 | Disposition: A | Discharge status: Home / Self Care | Payer: No Typology Code available for payment source | Source: Ambulatory Visit | Attending: Radiation Oncology | Admitting: Radiation Oncology

## 2023-02-04 ENCOUNTER — Other Ambulatory Visit: Payer: Self-pay

## 2023-02-04 DIAGNOSIS — Z510 Encounter for antineoplastic radiation therapy: Secondary | ICD-10-CM | POA: Diagnosis not present

## 2023-02-04 LAB — RAD ONC ARIA SESSION SUMMARY
Course Elapsed Days: 24
Plan Fractions Treated to Date: 18
Plan Prescribed Dose Per Fraction: 1.8 Gy
Plan Total Fractions Prescribed: 25
Plan Total Prescribed Dose: 45 Gy
Reference Point Dosage Given to Date: 32.4 Gy
Reference Point Session Dosage Given: 1.8 Gy
Session Number: 18

## 2023-02-05 ENCOUNTER — Other Ambulatory Visit: Payer: Self-pay

## 2023-02-05 ENCOUNTER — Ambulatory Visit
Admission: RE | Admit: 2023-02-05 | Discharge: 2023-02-05 | Disposition: A | Discharge status: Home / Self Care | Payer: No Typology Code available for payment source | Source: Ambulatory Visit | Attending: Radiation Oncology | Admitting: Radiation Oncology

## 2023-02-05 DIAGNOSIS — Z510 Encounter for antineoplastic radiation therapy: Secondary | ICD-10-CM | POA: Diagnosis not present

## 2023-02-05 LAB — RAD ONC ARIA SESSION SUMMARY
Course Elapsed Days: 25
Plan Fractions Treated to Date: 19
Plan Prescribed Dose Per Fraction: 1.8 Gy
Plan Total Fractions Prescribed: 25
Plan Total Prescribed Dose: 45 Gy
Reference Point Dosage Given to Date: 34.2 Gy
Reference Point Session Dosage Given: 1.8 Gy
Session Number: 19

## 2023-02-08 ENCOUNTER — Other Ambulatory Visit: Payer: Self-pay

## 2023-02-08 ENCOUNTER — Ambulatory Visit
Admission: RE | Admit: 2023-02-08 | Discharge: 2023-02-08 | Disposition: A | Discharge status: Home / Self Care | Payer: No Typology Code available for payment source | Source: Ambulatory Visit | Attending: Radiation Oncology | Admitting: Radiation Oncology

## 2023-02-08 DIAGNOSIS — Z510 Encounter for antineoplastic radiation therapy: Secondary | ICD-10-CM | POA: Diagnosis not present

## 2023-02-08 LAB — RAD ONC ARIA SESSION SUMMARY
Course Elapsed Days: 28
Plan Fractions Treated to Date: 20
Plan Prescribed Dose Per Fraction: 1.8 Gy
Plan Total Fractions Prescribed: 25
Plan Total Prescribed Dose: 45 Gy
Reference Point Dosage Given to Date: 36 Gy
Reference Point Session Dosage Given: 1.8 Gy
Session Number: 20

## 2023-02-09 ENCOUNTER — Other Ambulatory Visit: Payer: Self-pay

## 2023-02-09 ENCOUNTER — Ambulatory Visit
Admission: RE | Admit: 2023-02-09 | Discharge: 2023-02-09 | Disposition: A | Discharge status: Home / Self Care | Payer: No Typology Code available for payment source | Source: Ambulatory Visit | Attending: Radiation Oncology | Admitting: Radiation Oncology

## 2023-02-09 DIAGNOSIS — Z510 Encounter for antineoplastic radiation therapy: Secondary | ICD-10-CM | POA: Diagnosis not present

## 2023-02-09 LAB — RAD ONC ARIA SESSION SUMMARY
Course Elapsed Days: 29
Plan Fractions Treated to Date: 21
Plan Prescribed Dose Per Fraction: 1.8 Gy
Plan Total Fractions Prescribed: 25
Plan Total Prescribed Dose: 45 Gy
Reference Point Dosage Given to Date: 37.8 Gy
Reference Point Session Dosage Given: 1.8 Gy
Session Number: 21

## 2023-02-10 ENCOUNTER — Ambulatory Visit
Admission: RE | Admit: 2023-02-10 | Discharge: 2023-02-10 | Disposition: A | Discharge status: Home / Self Care | Payer: No Typology Code available for payment source | Source: Ambulatory Visit | Attending: Radiation Oncology | Admitting: Radiation Oncology

## 2023-02-10 ENCOUNTER — Other Ambulatory Visit: Payer: Self-pay

## 2023-02-10 DIAGNOSIS — Z510 Encounter for antineoplastic radiation therapy: Secondary | ICD-10-CM | POA: Diagnosis not present

## 2023-02-10 LAB — RAD ONC ARIA SESSION SUMMARY
Course Elapsed Days: 30
Plan Fractions Treated to Date: 22
Plan Prescribed Dose Per Fraction: 1.8 Gy
Plan Total Fractions Prescribed: 25
Plan Total Prescribed Dose: 45 Gy
Reference Point Dosage Given to Date: 39.6 Gy
Reference Point Session Dosage Given: 1.8 Gy
Session Number: 22

## 2023-02-11 ENCOUNTER — Ambulatory Visit
Admission: RE | Admit: 2023-02-11 | Discharge: 2023-02-11 | Disposition: A | Discharge status: Home / Self Care | Payer: No Typology Code available for payment source | Source: Ambulatory Visit | Attending: Radiation Oncology | Admitting: Radiation Oncology

## 2023-02-11 ENCOUNTER — Other Ambulatory Visit: Payer: Self-pay

## 2023-02-11 DIAGNOSIS — Z510 Encounter for antineoplastic radiation therapy: Secondary | ICD-10-CM | POA: Diagnosis not present

## 2023-02-11 LAB — RAD ONC ARIA SESSION SUMMARY
Course Elapsed Days: 31
Plan Fractions Treated to Date: 23
Plan Prescribed Dose Per Fraction: 1.8 Gy
Plan Total Fractions Prescribed: 25
Plan Total Prescribed Dose: 45 Gy
Reference Point Dosage Given to Date: 41.4 Gy
Reference Point Session Dosage Given: 1.8 Gy
Session Number: 23

## 2023-02-12 ENCOUNTER — Ambulatory Visit
Admission: RE | Admit: 2023-02-12 | Discharge: 2023-02-12 | Disposition: A | Discharge status: Home / Self Care | Payer: No Typology Code available for payment source | Source: Ambulatory Visit | Attending: Radiation Oncology

## 2023-02-12 ENCOUNTER — Other Ambulatory Visit: Payer: Self-pay

## 2023-02-12 ENCOUNTER — Ambulatory Visit
Admission: RE | Admit: 2023-02-12 | Discharge: 2023-02-12 | Disposition: A | Discharge status: Home / Self Care | Payer: No Typology Code available for payment source | Source: Ambulatory Visit | Attending: Radiation Oncology | Admitting: Radiation Oncology

## 2023-02-12 DIAGNOSIS — Z510 Encounter for antineoplastic radiation therapy: Secondary | ICD-10-CM | POA: Diagnosis not present

## 2023-02-12 LAB — RAD ONC ARIA SESSION SUMMARY
Course Elapsed Days: 32
Plan Fractions Treated to Date: 24
Plan Prescribed Dose Per Fraction: 1.8 Gy
Plan Total Fractions Prescribed: 25
Plan Total Prescribed Dose: 45 Gy
Reference Point Dosage Given to Date: 43.2 Gy
Reference Point Session Dosage Given: 1.8 Gy
Session Number: 24

## 2023-02-15 ENCOUNTER — Other Ambulatory Visit: Payer: Self-pay

## 2023-02-15 ENCOUNTER — Ambulatory Visit: Payer: No Typology Code available for payment source

## 2023-02-15 DIAGNOSIS — Z510 Encounter for antineoplastic radiation therapy: Secondary | ICD-10-CM | POA: Diagnosis not present

## 2023-02-15 LAB — RAD ONC ARIA SESSION SUMMARY
Course Elapsed Days: 35
Plan Fractions Treated to Date: 25
Plan Prescribed Dose Per Fraction: 1.8 Gy
Plan Total Fractions Prescribed: 25
Plan Total Prescribed Dose: 45 Gy
Reference Point Dosage Given to Date: 45 Gy
Reference Point Session Dosage Given: 1.8 Gy
Session Number: 25

## 2023-02-16 ENCOUNTER — Ambulatory Visit: Payer: No Typology Code available for payment source

## 2023-02-16 ENCOUNTER — Other Ambulatory Visit: Payer: Self-pay

## 2023-02-16 DIAGNOSIS — Z510 Encounter for antineoplastic radiation therapy: Secondary | ICD-10-CM | POA: Diagnosis not present

## 2023-02-16 LAB — RAD ONC ARIA SESSION SUMMARY
Course Elapsed Days: 36
Plan Fractions Treated to Date: 1
Plan Prescribed Dose Per Fraction: 1.8 Gy
Plan Total Fractions Prescribed: 13
Plan Total Prescribed Dose: 23.4 Gy
Reference Point Dosage Given to Date: 1.8 Gy
Reference Point Session Dosage Given: 1.8 Gy
Session Number: 26

## 2023-02-17 ENCOUNTER — Other Ambulatory Visit: Payer: Self-pay

## 2023-02-17 ENCOUNTER — Ambulatory Visit
Admission: RE | Admit: 2023-02-17 | Discharge: 2023-02-17 | Disposition: A | Discharge status: Home / Self Care | Payer: No Typology Code available for payment source | Source: Ambulatory Visit | Attending: Radiation Oncology | Admitting: Radiation Oncology

## 2023-02-17 DIAGNOSIS — Z510 Encounter for antineoplastic radiation therapy: Secondary | ICD-10-CM | POA: Diagnosis not present

## 2023-02-17 LAB — RAD ONC ARIA SESSION SUMMARY
Course Elapsed Days: 37
Plan Fractions Treated to Date: 2
Plan Prescribed Dose Per Fraction: 1.8 Gy
Plan Total Fractions Prescribed: 13
Plan Total Prescribed Dose: 23.4 Gy
Reference Point Dosage Given to Date: 3.6 Gy
Reference Point Session Dosage Given: 1.8 Gy
Session Number: 27

## 2023-02-18 ENCOUNTER — Ambulatory Visit
Admission: RE | Admit: 2023-02-18 | Discharge: 2023-02-18 | Disposition: A | Discharge status: Home / Self Care | Payer: No Typology Code available for payment source | Source: Ambulatory Visit | Attending: Radiation Oncology | Admitting: Radiation Oncology

## 2023-02-18 ENCOUNTER — Other Ambulatory Visit: Payer: Self-pay

## 2023-02-18 DIAGNOSIS — Z510 Encounter for antineoplastic radiation therapy: Secondary | ICD-10-CM | POA: Diagnosis not present

## 2023-02-18 LAB — RAD ONC ARIA SESSION SUMMARY
Course Elapsed Days: 38
Plan Fractions Treated to Date: 3
Plan Prescribed Dose Per Fraction: 1.8 Gy
Plan Total Fractions Prescribed: 13
Plan Total Prescribed Dose: 23.4 Gy
Reference Point Dosage Given to Date: 5.4 Gy
Reference Point Session Dosage Given: 1.8 Gy
Session Number: 28

## 2023-02-19 ENCOUNTER — Ambulatory Visit
Admission: RE | Admit: 2023-02-19 | Discharge: 2023-02-19 | Disposition: A | Discharge status: Home / Self Care | Payer: No Typology Code available for payment source | Source: Ambulatory Visit | Attending: Radiation Oncology

## 2023-02-19 ENCOUNTER — Other Ambulatory Visit: Payer: Self-pay

## 2023-02-19 ENCOUNTER — Ambulatory Visit
Admission: RE | Admit: 2023-02-19 | Discharge: 2023-02-19 | Disposition: A | Discharge status: Home / Self Care | Payer: No Typology Code available for payment source | Source: Ambulatory Visit | Attending: Radiation Oncology | Admitting: Radiation Oncology

## 2023-02-19 DIAGNOSIS — C61 Malignant neoplasm of prostate: Secondary | ICD-10-CM | POA: Insufficient documentation

## 2023-02-19 DIAGNOSIS — Z510 Encounter for antineoplastic radiation therapy: Secondary | ICD-10-CM | POA: Insufficient documentation

## 2023-02-19 LAB — RAD ONC ARIA SESSION SUMMARY
Course Elapsed Days: 39
Plan Fractions Treated to Date: 4
Plan Prescribed Dose Per Fraction: 1.8 Gy
Plan Total Fractions Prescribed: 13
Plan Total Prescribed Dose: 23.4 Gy
Reference Point Dosage Given to Date: 7.2 Gy
Reference Point Session Dosage Given: 1.8 Gy
Session Number: 29

## 2023-02-22 ENCOUNTER — Ambulatory Visit: Payer: No Typology Code available for payment source

## 2023-02-23 ENCOUNTER — Other Ambulatory Visit: Payer: Self-pay

## 2023-02-23 ENCOUNTER — Ambulatory Visit
Admission: RE | Admit: 2023-02-23 | Discharge: 2023-02-23 | Disposition: A | Discharge status: Home / Self Care | Payer: No Typology Code available for payment source | Source: Ambulatory Visit | Attending: Radiation Oncology | Admitting: Radiation Oncology

## 2023-02-23 DIAGNOSIS — Z510 Encounter for antineoplastic radiation therapy: Secondary | ICD-10-CM | POA: Diagnosis not present

## 2023-02-23 LAB — RAD ONC ARIA SESSION SUMMARY
Course Elapsed Days: 43
Plan Fractions Treated to Date: 5
Plan Prescribed Dose Per Fraction: 1.8 Gy
Plan Total Fractions Prescribed: 13
Plan Total Prescribed Dose: 23.4 Gy
Reference Point Dosage Given to Date: 9 Gy
Reference Point Session Dosage Given: 1.8 Gy
Session Number: 30

## 2023-02-24 ENCOUNTER — Ambulatory Visit
Admission: RE | Admit: 2023-02-24 | Discharge: 2023-02-24 | Disposition: A | Discharge status: Home / Self Care | Payer: No Typology Code available for payment source | Source: Ambulatory Visit | Attending: Radiation Oncology | Admitting: Radiation Oncology

## 2023-02-24 ENCOUNTER — Other Ambulatory Visit: Payer: Self-pay

## 2023-02-24 DIAGNOSIS — Z510 Encounter for antineoplastic radiation therapy: Secondary | ICD-10-CM | POA: Diagnosis not present

## 2023-02-24 LAB — RAD ONC ARIA SESSION SUMMARY
Course Elapsed Days: 44
Plan Fractions Treated to Date: 6
Plan Prescribed Dose Per Fraction: 1.8 Gy
Plan Total Fractions Prescribed: 13
Plan Total Prescribed Dose: 23.4 Gy
Reference Point Dosage Given to Date: 10.8 Gy
Reference Point Session Dosage Given: 1.8 Gy
Session Number: 31

## 2023-02-25 ENCOUNTER — Other Ambulatory Visit: Payer: Self-pay

## 2023-02-25 ENCOUNTER — Ambulatory Visit
Admission: RE | Admit: 2023-02-25 | Discharge: 2023-02-25 | Disposition: A | Discharge status: Home / Self Care | Payer: No Typology Code available for payment source | Source: Ambulatory Visit | Attending: Radiation Oncology | Admitting: Radiation Oncology

## 2023-02-25 DIAGNOSIS — Z510 Encounter for antineoplastic radiation therapy: Secondary | ICD-10-CM | POA: Diagnosis not present

## 2023-02-25 LAB — RAD ONC ARIA SESSION SUMMARY
Course Elapsed Days: 45
Plan Fractions Treated to Date: 7
Plan Prescribed Dose Per Fraction: 1.8 Gy
Plan Total Fractions Prescribed: 13
Plan Total Prescribed Dose: 23.4 Gy
Reference Point Dosage Given to Date: 12.6 Gy
Reference Point Session Dosage Given: 1.8 Gy
Session Number: 32

## 2023-02-26 ENCOUNTER — Ambulatory Visit
Admission: RE | Admit: 2023-02-26 | Discharge: 2023-02-26 | Disposition: A | Discharge status: Home / Self Care | Payer: No Typology Code available for payment source | Source: Ambulatory Visit | Attending: Radiation Oncology

## 2023-02-26 ENCOUNTER — Ambulatory Visit
Admission: RE | Admit: 2023-02-26 | Discharge: 2023-02-26 | Disposition: A | Discharge status: Home / Self Care | Payer: No Typology Code available for payment source | Source: Ambulatory Visit | Attending: Radiation Oncology | Admitting: Radiation Oncology

## 2023-02-26 ENCOUNTER — Other Ambulatory Visit: Payer: Self-pay

## 2023-02-26 DIAGNOSIS — Z510 Encounter for antineoplastic radiation therapy: Secondary | ICD-10-CM | POA: Diagnosis not present

## 2023-02-26 LAB — RAD ONC ARIA SESSION SUMMARY
Course Elapsed Days: 46
Plan Fractions Treated to Date: 8
Plan Prescribed Dose Per Fraction: 1.8 Gy
Plan Total Fractions Prescribed: 13
Plan Total Prescribed Dose: 23.4 Gy
Reference Point Dosage Given to Date: 14.4 Gy
Reference Point Session Dosage Given: 1.8 Gy
Session Number: 33

## 2023-03-01 ENCOUNTER — Ambulatory Visit
Admission: RE | Admit: 2023-03-01 | Discharge: 2023-03-01 | Disposition: A | Discharge status: Home / Self Care | Payer: No Typology Code available for payment source | Source: Ambulatory Visit | Attending: Radiation Oncology

## 2023-03-01 ENCOUNTER — Other Ambulatory Visit: Payer: Self-pay

## 2023-03-01 DIAGNOSIS — Z510 Encounter for antineoplastic radiation therapy: Secondary | ICD-10-CM | POA: Diagnosis not present

## 2023-03-01 LAB — RAD ONC ARIA SESSION SUMMARY
Course Elapsed Days: 49
Plan Fractions Treated to Date: 9
Plan Prescribed Dose Per Fraction: 1.8 Gy
Plan Total Fractions Prescribed: 13
Plan Total Prescribed Dose: 23.4 Gy
Reference Point Dosage Given to Date: 16.2 Gy
Reference Point Session Dosage Given: 1.8 Gy
Session Number: 34

## 2023-03-02 ENCOUNTER — Other Ambulatory Visit: Payer: Self-pay

## 2023-03-02 ENCOUNTER — Ambulatory Visit
Admission: RE | Admit: 2023-03-02 | Discharge: 2023-03-02 | Disposition: A | Discharge status: Home / Self Care | Payer: No Typology Code available for payment source | Source: Ambulatory Visit | Attending: Radiation Oncology

## 2023-03-02 DIAGNOSIS — Z510 Encounter for antineoplastic radiation therapy: Secondary | ICD-10-CM | POA: Diagnosis not present

## 2023-03-02 LAB — RAD ONC ARIA SESSION SUMMARY
Course Elapsed Days: 50
Plan Fractions Treated to Date: 10
Plan Prescribed Dose Per Fraction: 1.8 Gy
Plan Total Fractions Prescribed: 13
Plan Total Prescribed Dose: 23.4 Gy
Reference Point Dosage Given to Date: 18 Gy
Reference Point Session Dosage Given: 1.8 Gy
Session Number: 35

## 2023-03-03 ENCOUNTER — Other Ambulatory Visit: Payer: Self-pay

## 2023-03-03 ENCOUNTER — Ambulatory Visit
Admission: RE | Admit: 2023-03-03 | Discharge: 2023-03-03 | Disposition: A | Discharge status: Home / Self Care | Payer: No Typology Code available for payment source | Source: Ambulatory Visit | Attending: Radiation Oncology | Admitting: Radiation Oncology

## 2023-03-03 ENCOUNTER — Ambulatory Visit: Payer: No Typology Code available for payment source

## 2023-03-03 DIAGNOSIS — Z510 Encounter for antineoplastic radiation therapy: Secondary | ICD-10-CM | POA: Diagnosis not present

## 2023-03-03 LAB — RAD ONC ARIA SESSION SUMMARY
Course Elapsed Days: 51
Plan Fractions Treated to Date: 11
Plan Prescribed Dose Per Fraction: 1.8 Gy
Plan Total Fractions Prescribed: 13
Plan Total Prescribed Dose: 23.4 Gy
Reference Point Dosage Given to Date: 19.8 Gy
Reference Point Session Dosage Given: 1.8 Gy
Session Number: 36

## 2023-03-04 ENCOUNTER — Ambulatory Visit: Payer: No Typology Code available for payment source

## 2023-03-04 ENCOUNTER — Other Ambulatory Visit: Payer: Self-pay

## 2023-03-04 ENCOUNTER — Ambulatory Visit
Admission: RE | Admit: 2023-03-04 | Discharge: 2023-03-04 | Disposition: A | Discharge status: Home / Self Care | Payer: No Typology Code available for payment source | Source: Ambulatory Visit | Attending: Radiation Oncology | Admitting: Radiation Oncology

## 2023-03-04 DIAGNOSIS — Z510 Encounter for antineoplastic radiation therapy: Secondary | ICD-10-CM | POA: Diagnosis not present

## 2023-03-04 LAB — RAD ONC ARIA SESSION SUMMARY
Course Elapsed Days: 52
Plan Fractions Treated to Date: 12
Plan Prescribed Dose Per Fraction: 1.8 Gy
Plan Total Fractions Prescribed: 13
Plan Total Prescribed Dose: 23.4 Gy
Reference Point Dosage Given to Date: 21.6 Gy
Reference Point Session Dosage Given: 1.8 Gy
Session Number: 37

## 2023-03-05 ENCOUNTER — Ambulatory Visit
Admission: RE | Admit: 2023-03-05 | Discharge: 2023-03-05 | Disposition: A | Discharge status: Home / Self Care | Payer: No Typology Code available for payment source | Source: Ambulatory Visit | Attending: Radiation Oncology | Admitting: Radiation Oncology

## 2023-03-05 ENCOUNTER — Ambulatory Visit
Admission: RE | Admit: 2023-03-05 | Discharge: 2023-03-05 | Disposition: A | Discharge status: Home / Self Care | Payer: No Typology Code available for payment source | Source: Ambulatory Visit | Attending: Radiation Oncology

## 2023-03-05 ENCOUNTER — Other Ambulatory Visit: Payer: Self-pay

## 2023-03-05 DIAGNOSIS — Z510 Encounter for antineoplastic radiation therapy: Secondary | ICD-10-CM | POA: Diagnosis not present

## 2023-03-05 DIAGNOSIS — R9721 Rising PSA following treatment for malignant neoplasm of prostate: Secondary | ICD-10-CM

## 2023-03-05 LAB — RAD ONC ARIA SESSION SUMMARY
Course Elapsed Days: 53
Plan Fractions Treated to Date: 13
Plan Prescribed Dose Per Fraction: 1.8 Gy
Plan Total Fractions Prescribed: 13
Plan Total Prescribed Dose: 23.4 Gy
Reference Point Dosage Given to Date: 23.4 Gy
Reference Point Session Dosage Given: 1.8 Gy
Session Number: 38

## 2023-03-08 NOTE — Radiation Completion Notes (Addendum)
  Radiation Oncology         [phone] ________________________________  Name: Jose Wilkins MRN: [id number]  Date: 03/05/2023  DOB: 1946-04-17  Referring Physician: Frederic Grain, M.D. Date of Service: 2023-03-08 Radiation Oncologist: Adina Barge, M.D. [name]     RADIATION ONCOLOGY END OF TREATMENT NOTE     Diagnosis: 77 y/o man with biochemical recurrence of pT2N0, Gleason 4+3 adenocarcinoma of the prostate with current postoperative PSA of 0.25.   Intent: Curative     ==========DELIVERED PLANS==========  First Treatment Date: 2023-01-11 - Last Treatment Date: 2023-03-05   Plan Name: ProstBed_Pelv Site: Prostate Bed Technique: IMRT Mode: Photon Dose Per Fraction: 1.8 Gy Prescribed Dose (Delivered / Prescribed): 45 Gy / 45 Gy Prescribed Fxs (Delivered / Prescribed): 25 / 25   Plan Name: ProstBed_Bst Site: Prostate Bed Technique: IMRT Mode: Photon Dose Per Fraction: 1.8 Gy Prescribed Dose (Delivered / Prescribed): 23.4 Gy / 23.4 Gy Prescribed Fxs (Delivered / Prescribed): 13 / 13     ==========ON TREATMENT VISIT DATES========== 2023-01-14, 2023-01-22, 2023-01-29, 2023-02-04, 2023-02-12, 2023-02-19, 2023-02-26, 2023-03-05   See weekly On Treatment Notes in Epic for details.  He tolerated the daily radiation treatments relatively well with some increased LUTS and modest fatigue.  The patient will receive a call in about one month from the radiation oncology department. He will continue follow up with his team at the [facility] as well.  ------------------------------------------------   Donnice Barge, MD [facility]  Radiation Oncology Direct Dial : [phone]  Fax: [phone] [email or web address]  Skype  LinkedIn

## 2023-03-08 NOTE — Progress Notes (Signed)
Patient was a RadOnc Consult on 12/18/22 for his biochemical recurrence of pT2N0, Gleason 4+3 adenocarcinoma of the prostate with current postoperative PSA of 0.25.  Patient proceed with treatment recommendations of 7.5 weeks of salvage external beam therapy and had his final radiation treatment on 03/05/23.   Patient is scheduled for a post treatment nurse call on 04/07/23 and remains under care with [facility] for urology follow up's.   RN left message for patient requesting call back to review post treatment education and ensure appropriate follow up's.

## 2023-04-07 ENCOUNTER — Ambulatory Visit
Admission: RE | Admit: 2023-04-07 | Discharge: 2023-04-07 | Disposition: A | Discharge status: Home / Self Care | Payer: No Typology Code available for payment source | Source: Ambulatory Visit | Attending: Internal Medicine

## 2023-04-16 NOTE — Progress Notes (Signed)
  Radiation Oncology         [phone]) [phone] ________________________________  Name: Jose Wilkins MRN: [id number]  Date of Service: 04/16/2023  DOB: 07/15/1945  Post Treatment Telephone Note  Diagnosis:  C61 Malignant neoplasm of prostate (as documented in provider EOT note)  Pre Treatment IPSS Score: 4 (as documented in the provider consult note)  The patient was available for call today.   Symptoms of fatigue have improved since completing therapy.  Symptoms of bladder changes have improved since completing therapy. Current symptoms include none, and medications for bladder symptoms include none.  Symptoms of bowel changes have improved since completing therapy. Current symptoms include none, and medications for bowel symptoms include none.   Post Treatment IPSS Score: IPSS Questionnaire (AUA-7): Over the past month.   1)  How often have you had a sensation of not emptying your bladder completely after you finish urinating?  3 - About half the time  2)  How often have you had to urinate again less than two hours after you finished urinating? 0 - Not at all  3)  How often have you found you stopped and started again several times when you urinated?  0 - Not at all  4) How difficult have you found it to postpone urination?  0 - Not at all  5) How often have you had a weak urinary stream?  1 - Less than 1 time in 5  6) How often have you had to push or strain to begin urination?  0 - Not at all  7) How many times did you most typically get up to urinate from the time you went to bed until the time you got up in the morning?  1 - 1 time  Total score:  5. Which indicates mild symptoms  0-7 mildly symptomatic   8-19 moderately symptomatic   20-35 severely symptomatic    Patient has a scheduled follow up visit with his urologist, Dr. Rogelia Mire, on 04/2023 at the [facility] in [place] for ongoing surveillance. He was counseled that PSA levels will be drawn in the urology office, and was  reassured that additional time is expected to improve bowel and bladder symptoms. He was encouraged to call back with concerns or questions regarding radiation.   This concludes the interaction.  Ruel Favors, LPN

## 2023-05-12 DIAGNOSIS — H524 Presbyopia: Secondary | ICD-10-CM | POA: Diagnosis not present
